# Patient Record
Sex: Female | Born: 1935 | Race: White | Hispanic: No | Smoking: Former smoker
Health system: Southern US, Community
[De-identification: ages and names within clinical notes are randomized; demographics above are authoritative.]

## PROBLEM LIST (undated history)

## (undated) DIAGNOSIS — M810 Age-related osteoporosis without current pathological fracture: Secondary | ICD-10-CM

## (undated) DIAGNOSIS — I10 Essential (primary) hypertension: Secondary | ICD-10-CM

## (undated) DIAGNOSIS — I2510 Atherosclerotic heart disease of native coronary artery without angina pectoris: Secondary | ICD-10-CM

## (undated) DIAGNOSIS — I219 Acute myocardial infarction, unspecified: Secondary | ICD-10-CM

## (undated) HISTORY — PX: ABDOMINAL HYSTERECTOMY: SHX81

## (undated) HISTORY — DX: Acute myocardial infarction, unspecified: I21.9

## (undated) HISTORY — DX: Age-related osteoporosis without current pathological fracture: M81.0

---
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
## 1996-09-04 HISTORY — PX: CATARACT EXTRACTION: SUR2

## 2001-02-27 ENCOUNTER — Ambulatory Visit (HOSPITAL_COMMUNITY): Admission: RE | Admit: 2001-02-27 | Discharge: 2001-02-27 | Payer: Self-pay | Admitting: Family Medicine

## 2001-02-27 ENCOUNTER — Encounter: Payer: Self-pay | Admitting: Family Medicine

## 2002-03-05 ENCOUNTER — Encounter: Payer: Self-pay | Admitting: Family Medicine

## 2002-03-05 ENCOUNTER — Ambulatory Visit (HOSPITAL_COMMUNITY): Admission: RE | Admit: 2002-03-05 | Discharge: 2002-03-05 | Payer: Self-pay | Admitting: Family Medicine

## 2002-03-31 ENCOUNTER — Ambulatory Visit (HOSPITAL_COMMUNITY): Admission: RE | Admit: 2002-03-31 | Discharge: 2002-03-31 | Payer: Self-pay | Admitting: Internal Medicine

## 2003-03-26 ENCOUNTER — Ambulatory Visit (HOSPITAL_COMMUNITY): Admission: RE | Admit: 2003-03-26 | Discharge: 2003-03-26 | Payer: Self-pay | Admitting: Family Medicine

## 2003-03-26 ENCOUNTER — Encounter: Payer: Self-pay | Admitting: Family Medicine

## 2004-04-13 ENCOUNTER — Ambulatory Visit (HOSPITAL_COMMUNITY): Admission: RE | Admit: 2004-04-13 | Discharge: 2004-04-13 | Payer: Self-pay | Admitting: Family Medicine

## 2005-05-03 ENCOUNTER — Ambulatory Visit (HOSPITAL_COMMUNITY): Admission: RE | Admit: 2005-05-03 | Discharge: 2005-05-03 | Payer: Self-pay | Admitting: Family Medicine

## 2006-06-04 ENCOUNTER — Ambulatory Visit (HOSPITAL_COMMUNITY): Admission: RE | Admit: 2006-06-04 | Discharge: 2006-06-04 | Payer: Self-pay | Admitting: Family Medicine

## 2006-06-20 ENCOUNTER — Ambulatory Visit (HOSPITAL_COMMUNITY): Admission: RE | Admit: 2006-06-20 | Discharge: 2006-06-20 | Payer: Self-pay | Admitting: Family Medicine

## 2007-03-05 HISTORY — PX: COLONOSCOPY: SHX174

## 2007-04-01 ENCOUNTER — Encounter: Payer: Self-pay | Admitting: Internal Medicine

## 2007-04-01 ENCOUNTER — Ambulatory Visit: Payer: Self-pay | Admitting: Internal Medicine

## 2007-04-01 ENCOUNTER — Ambulatory Visit (HOSPITAL_COMMUNITY): Admission: RE | Admit: 2007-04-01 | Discharge: 2007-04-01 | Payer: Self-pay | Admitting: Internal Medicine

## 2007-06-01 ENCOUNTER — Emergency Department (HOSPITAL_COMMUNITY): Admission: EM | Admit: 2007-06-01 | Discharge: 2007-06-02 | Payer: Self-pay | Admitting: Emergency Medicine

## 2007-06-05 DIAGNOSIS — I219 Acute myocardial infarction, unspecified: Secondary | ICD-10-CM

## 2007-06-05 HISTORY — DX: Acute myocardial infarction, unspecified: I21.9

## 2007-07-19 ENCOUNTER — Ambulatory Visit (HOSPITAL_COMMUNITY): Admission: RE | Admit: 2007-07-19 | Discharge: 2007-07-19 | Payer: Self-pay | Admitting: Family Medicine

## 2008-07-21 ENCOUNTER — Ambulatory Visit (HOSPITAL_COMMUNITY): Admission: RE | Admit: 2008-07-21 | Discharge: 2008-07-21 | Payer: Self-pay | Admitting: Family Medicine

## 2009-11-22 ENCOUNTER — Ambulatory Visit (HOSPITAL_COMMUNITY): Admission: RE | Admit: 2009-11-22 | Discharge: 2009-11-22 | Payer: Self-pay | Admitting: Family Medicine

## 2011-01-17 NOTE — Op Note (Signed)
Julia Werner, Julia Werner              ACCOUNT NO.:  [id number]   MEDICAL RECORD NO.:  [id number]          PATIENT TYPE:  AMB   LOCATION:  DAY                           FACILITY:  [facility]   PHYSICIAN:  R. Roetta Sessions, M.D. DATE OF BIRTH:  [birth year]   DATE OF PROCEDURE:  04/01/2007  DATE OF DISCHARGE:                               OPERATIVE REPORT   PROCEDURE:  Colonoscopy with biopsies.   INDICATIONS FOR PROCEDURE:  An 77 year old Caucasian female who comes  for high-risk screening colonoscopy.  She has a positive family history  of colon cancer in her sister.  She last had a colonoscopy in 2003 which  revealed left-sided diverticula and the hyperplastic polyp.  She has no  lower GI tract symptoms currently.  Colonoscopy is now being done as a  high-risk screening maneuver.  This approach has been discussed with the  patient at length.  Potential risks, benefits and alternatives have been  reviewed, questions answered.  She is agreeable.  Please see  documentation on the medical record.   PROCEDURE NOTE:  O2 saturation, blood pressure, pulse and respirations  were monitored throughout the entire procedure.   CONSCIOUS SEDATION:  Versed 3 mg IV and Demerol 75 mg IV in divided  doses.   INSTRUMENT:  Pentax video chip system.   FINDINGS:  Digital rectal exam revealed a pea-size abnormality just  inside the anal verge.   ENDOSCOPIC FINDINGS:  The prep was good.   Rectum:  Rectal mucosa including retroflexed view of the anal verge  demonstrated prominent internal hemorrhoids and papilla and an area of  verrucous appearing mucosa extending just 1-2 cm into the distal rectal  mucosa, no obvious neoplasm seen.  Please see photos retroflexed.  I  elected to biopsy the leading edge of this verrucous appearing mucosa.  There was also diminutive polyp in the proximal rectum which was cold  biopsy/removed.  Remainder of the rectal mucosa appeared normal.   Colon:  Colonic mucosa was  surveyed from the rectosigmoid junction  through the left transverse, right colon to the area of the appendiceal  orifice, ileocecal valve and cecum.  These structures were well seen and  photographed for the record.  From this level, the scope was slowly  withdrawn.  All previously mentioned mucosal surfaces were again seen.  The patient had pan colonic diverticula and there was a diminutive polyp  noted in the mid sigmoid which was cold biopsy/removed.  Remainder of  colonic mucosa appeared normal.  The patient tolerated the procedure  well as reactive to endoscopy.   IMPRESSION:  1. Prominent internal hemorrhoids, anal papilla and verrucous      appearing mucosa distal rectum, uncertain significance, biopsied.  2. Proximal diminutive rectal polyp cold biopsied.  The remainder of      the rectal mucosa appeared normal.  3. Left-sided diverticula, diminutive sigmoid polyp cold      biopsies/removed.  Remainder of colonic mucosa appeared normal.   RECOMMENDATIONS:  1. Diverticulosis literature provided to [name].  2. Follow-up on path.  3. Further recommendations to follow.  Jonathon Bellows, M.D.  Electronically Signed     RMR/MEDQ  D:  04/01/2007  T:  04/01/2007  Job:  [id number]   cc:   Lorin Picket A. Gerda Diss, MD  Fax: [phone]

## 2011-01-20 NOTE — Op Note (Signed)
[name], Julia Werner                        ACCOUNT NO.:  [id number]   MEDICAL RECORD NO.:  [id number]                   PATIENT TYPE:  AMB   LOCATION:  DAY                                  FACILITY:  [facility]   PHYSICIAN:  Gerrit Friends. Rourk, M.D.               DATE OF BIRTH:  Aug 12, 1921   DATE OF PROCEDURE:  03/31/2002  DATE OF DISCHARGE:  03/31/2002                                  PROCEDURE NOTE   PROCEDURE:  Colonoscopy and biopsy.   INDICATIONS FOR PROCEDURE:  The patient is a 77 year old lady with no bowel  symptoms.  She underwent a screening sigmoidoscopy back in 2000 and no  significant abnormalities were found.  Unfortunately since that time, her  sister was diagnosed with colorectal cancer.  She now is under a high risk  screening colonoscopy.  The approach of the colonoscopy has been discussed  with the patient at the bedside and the potential risks, benefits, and  alternatives have been reviewed. questions were answered and she is  agreeable.  Please see my handwritten H&P for more information She is low  risk for conscious sedation with Versed and Demerol.   PROCEDURE NOTE:  O2 saturations, blood pressure, pulse, and respirations  were monitored throughout the entire procedure.   CONSCIOUS SEDATION:  Versed 2 mg IV, Demerol 25 mg IV in divided doses.   INSTRUMENT USED:  Olympus video chip colonoscope.   FINDINGS:  Digital rectal examination revealed no abnormalities.   ENDOSCOPIC FINDINGS:  Prep was good.   RECTUM:  Examination of rectal mucosa clean.  Retroflexed view in the anal  verge revealed only a single anal papilla and internal hemorrhoids, and a 5  mm polyp at 10 cm.   COLON:  Colonic mucosa was surveyed from the rectosigmoid junction through  the left, transverse, right colon to the area of the appendiceal orifice,  ileocecal valve and cecum.  These structures were well-seen and photographed  for the record.  The patient had numerous left-sided  diverticulum and the  colonic mucosa to the cecum appeared normal from the level of the cecum and  ileocecal valve.  The scope was slowly and cautiously withdrawn.  All  previous mentioned mucosal surfaces were again seen and no other  abnormalities were observed.  A small polyp in the rectum was cold-  biopsied/removed.  The patient tolerated the procedure well and was  reactivated.   ENDOSCOPY IMPRESSION:  1. Anal papilla and internal hemorrhoids, diminutive rectal polyp cold-     biopsied/removed.  Random rectum mucosa appeared  normal.  2. Left-sided diverticulum and colonic mucosa appeared normal.   RECOMMENDATIONS:  1. Diverticulosis literature was provided to [name].  2.     Daily Metamucil or Citrucel fiber supplement.  3. Follow up on pathology.  4. Further recommendations to follow.  Gerrit Friends. Rourk, M.D.    RMR/MEDQ  D:  03/31/2002  T:  04/03/2002  Job:  [id number]   cc:   Lorin Picket A. Gerda Diss, M.D.

## 2011-06-15 LAB — POCT CARDIAC MARKERS
CKMB, poc: 2.9
Myoglobin, poc: 183
Myoglobin, poc: 72.6
Myoglobin, poc: 97.4
Troponin i, poc: <0.05

## 2011-06-15 LAB — CBC
HCT: 43.1
RBC: 4.88
WBC: 8.5

## 2011-06-15 LAB — CK TOTAL AND CKMB (NOT AT ARMC)
Relative Index: INVALID
Total CK: 84

## 2011-06-15 LAB — BASIC METABOLIC PANEL
CO2: 28
Calcium: 10.6 — ABNORMAL HIGH
Chloride: 103
Creatinine, Ser: 0.9
GFR calc non Af Amer: 60 — ABNORMAL LOW

## 2011-06-15 LAB — B-NATRIURETIC PEPTIDE (CONVERTED LAB): Pro B Natriuretic peptide (BNP): <30

## 2011-06-15 LAB — DIFFERENTIAL
Basophils Relative: 0
Neutrophils Relative %: 53

## 2011-11-24 ENCOUNTER — Other Ambulatory Visit: Payer: Self-pay | Admitting: Family Medicine

## 2011-12-20 ENCOUNTER — Ambulatory Visit (HOSPITAL_COMMUNITY)
Admission: RE | Admit: 2011-12-20 | Discharge: 2011-12-20 | Disposition: A | Discharge status: Home / Self Care | Payer: Medicare Other | Source: Ambulatory Visit | Attending: Family Medicine | Admitting: Family Medicine

## 2011-12-20 DIAGNOSIS — M810 Age-related osteoporosis without current pathological fracture: Secondary | ICD-10-CM | POA: Insufficient documentation

## 2011-12-20 DIAGNOSIS — Z780 Asymptomatic menopausal state: Secondary | ICD-10-CM | POA: Insufficient documentation

## 2012-02-06 ENCOUNTER — Emergency Department (HOSPITAL_COMMUNITY): Payer: Medicare Other

## 2012-02-06 ENCOUNTER — Emergency Department (HOSPITAL_COMMUNITY)
Admission: EM | Admit: 2012-02-06 | Discharge: 2012-02-06 | Disposition: A | Discharge status: Home / Self Care | Payer: Medicare Other | Attending: Emergency Medicine | Admitting: Emergency Medicine

## 2012-02-06 ENCOUNTER — Encounter (HOSPITAL_COMMUNITY): Payer: Self-pay | Admitting: *Deleted

## 2012-02-06 DIAGNOSIS — G319 Degenerative disease of nervous system, unspecified: Secondary | ICD-10-CM | POA: Insufficient documentation

## 2012-02-06 DIAGNOSIS — S8000XA Contusion of unspecified knee, initial encounter: Secondary | ICD-10-CM | POA: Insufficient documentation

## 2012-02-06 DIAGNOSIS — I2510 Atherosclerotic heart disease of native coronary artery without angina pectoris: Secondary | ICD-10-CM | POA: Insufficient documentation

## 2012-02-06 DIAGNOSIS — W19XXXA Unspecified fall, initial encounter: Secondary | ICD-10-CM

## 2012-02-06 DIAGNOSIS — I10 Essential (primary) hypertension: Secondary | ICD-10-CM | POA: Insufficient documentation

## 2012-02-06 DIAGNOSIS — W010XXA Fall on same level from slipping, tripping and stumbling without subsequent striking against object, initial encounter: Secondary | ICD-10-CM | POA: Insufficient documentation

## 2012-02-06 HISTORY — DX: Essential (primary) hypertension: I10

## 2012-02-06 HISTORY — DX: Atherosclerotic heart disease of native coronary artery without angina pectoris: I25.10

## 2012-02-06 NOTE — ED Provider Notes (Signed)
History  This chart was scribed for Glynn Octave, MD by Bennett Scrape. This patient was seen in room APA01/APA01 and the patient's care was started at 3:02PM.  CSN: [id number]  Arrival date & time 02/06/12  1452   First MD Initiated Contact with Patient 02/06/12 1502      Chief Complaint  Patient presents with  . Fall    The history is provided by the patient. No language interpreter was used.    Julia Werner is a 78 y.o. female who presents to the Emergency Department complaining of a slip and fall at the [facility] approximately 30 minutes PTA. She states that she was walking down the hall when she slipped in a puddle of water landing on her right knee. She c/o mild right knee pain currently. She denies any modifying factors. She has not taken any OTC medications PTA to improve her pain. She denies having problems with the right knee prior to the fall. She denies LOC and head trauma. She denies dizziness or syncope as the cause of the fall. She denies HA, nausea and right hip pain as associated symptoms. Pt has a h/o CAD and HTN. She states that she takes baby ASA and plavix. She is a former smoker but denies alcohol use.  Dr. Lilyan Punt is PCP.   Past Medical History  Diagnosis Date  . Coronary artery disease   . Hypertension     Past Surgical History  Procedure Date  . Abdominal hysterectomy     History reviewed. No pertinent family history.  History  Substance Use Topics  . Smoking status: Former Games developer  . Smokeless tobacco: Not on file  . Alcohol Use: No    Review of Systems  A complete 10 system review of systems was obtained and all systems are negative except as noted in the HPI and PMH.   Allergies  Review of patient's allergies indicates no known allergies.  Home Medications  No current outpatient prescriptions on file.  Triage Vitals: BP 154/89  Pulse 70  Temp(Src) 98.1 F (36.7 C) (Oral)  Resp 17  Ht 5\' 4"  (1.626 m)  Wt 110 lb (49.896  kg)  BMI 18.88 kg/m2  SpO2 98%  Physical Exam  Nursing note and vitals reviewed. Constitutional: She is oriented to person, place, and time. She appears well-developed and well-nourished. No distress.  HENT:  Head: Normocephalic and atraumatic.  Eyes: Conjunctivae and EOM are normal.  Neck: Neck supple. No tracheal deviation present.  Cardiovascular: Normal rate and regular rhythm.   Pulmonary/Chest: Effort normal and breath sounds normal. No respiratory distress.  Abdominal: Soft. She exhibits no distension.  Musculoskeletal: Normal range of motion.       Small abrasion and ecchymosis to the right patella, no effusion, full ROM, no ligament laxation, flexion and extension intact, +2 DP and PT pulses  Neurological: She is alert and oriented to person, place, and time.       No sensory deficits  Skin: Skin is warm and dry.  Psychiatric: She has a normal mood and affect. Her behavior is normal.    ED Course  Procedures (including critical care time)  DIAGNOSTIC STUDIES: Oxygen Saturation is 98% on room air, normal by my interpretation.    COORDINATION OF CARE: 3:12PM-Discussed treatment plan which includes x-ray of the right knee with pt and pt agreed to plan. Pt turned down pain medications. 3:45PM-Informed radiology reports with pt. Discussed treatment plan with pt and pt agreed to plan.  Labs  Reviewed - No data to display Ct Head Wo Contrast  02/06/2012  *RADIOLOGY REPORT*  Clinical Data: Fall  CT HEAD WITHOUT CONTRAST  Technique:  Contiguous axial images were obtained from the base of the skull through the vertex without contrast.  Comparison: None.  Findings: No skull fracture is noted.  Paranasal sinuses and mastoid air cells are unremarkable.  No intracranial hemorrhage, mass effect or midline shift.  No acute infarction.  No mass lesion is noted on this unenhanced scan.  Mild cerebral atrophy.  Mild atherosclerotic calcifications of carotid siphon are noted.  IMPRESSION: No  acute intracranial abnormality.  Mild cerebral atrophy.  Mild atherosclerotic calcifications of carotid siphon.  Original Report Authenticated By: Natasha Mead, M.D.   Dg Knee Complete 4 Views Right  02/06/2012  *RADIOLOGY REPORT*  Clinical Data: Right knee pain and bruising post fall  RIGHT KNEE - COMPLETE 4+ VIEW  Comparison: None  Findings: Bones appear diffusely demineralized. Joint spaces fairly well preserved for age. No definite fracture, dislocation or bone destruction. No knee joint effusion.  IMPRESSION: No acute osseous abnormalities. Osseous demineralization.  Original Report Authenticated By: Lollie Marrow, M.D.     No diagnosis found.    MDM  Slip and mechanical fall on some water while visiting [facility]. Did not hit head or lose consciousness. Only complaint is right knee pain. No weakness, numbness, tingling. On aspirin and Plavix.  Imaging negative for acute injury.  Patient declines analgesia.  Stable for PCP followup.  I personally performed the services described in this documentation, which was scribed in my presence.  The recorded information has been reviewed and considered.       Glynn Octave, MD 02/06/12 [phone]

## 2012-02-06 NOTE — ED Notes (Signed)
Pt states that she slipped and fell on some water in the floor at the [facility]. C/o pain in her right knee.

## 2012-02-06 NOTE — Discharge Instructions (Signed)
Contusion There is no evidence of serious injury. Use Motrin or Tylenol as needed for pain. Followup with her Dr. This week. Return to the ED if you develop new or worsening symptoms. A contusion is a deep bruise. Contusions are the result of an injury that caused bleeding under the skin. The contusion may turn blue, purple, or yellow. Minor injuries will give you a painless contusion, but more severe contusions may stay painful and swollen for a few weeks.  CAUSES  A contusion is usually caused by a blow, trauma, or direct force to an area of the body. SYMPTOMS   Swelling and redness of the injured area.   Bruising of the injured area.   Tenderness and soreness of the injured area.   Pain.  DIAGNOSIS  The diagnosis can be made by taking a history and physical exam. An X-ray, CT scan, or MRI may be needed to determine if there were any associated injuries, such as fractures. TREATMENT  Specific treatment will depend on what area of the body was injured. In general, the best treatment for a contusion is resting, icing, elevating, and applying cold compresses to the injured area. Over-the-counter medicines may also be recommended for pain control. Ask your caregiver what the best treatment is for your contusion. HOME CARE INSTRUCTIONS   Put ice on the injured area.   Put ice in a plastic bag.   Place a towel between your skin and the bag.   Leave the ice on for 15 to 20 minutes, 3 to 4 times a day.   Only take over-the-counter or prescription medicines for pain, discomfort, or fever as directed by your caregiver. Your caregiver may recommend avoiding anti-inflammatory medicines (aspirin, ibuprofen, and naproxen) for 48 hours because these medicines may increase bruising.   Rest the injured area.   If possible, elevate the injured area to reduce swelling.  SEEK IMMEDIATE MEDICAL CARE IF:   You have increased bruising or swelling.   You have pain that is getting worse.   Your  swelling or pain is not relieved with medicines.  MAKE SURE YOU:   Understand these instructions.   Will watch your condition.   Will get help right away if you are not doing well or get worse.  Document Released: 05/31/2005 Document Revised: 08/10/2011 Document Reviewed: 06/26/2011 [facility] Patient Information 2012 [place], [place].

## 2012-02-12 ENCOUNTER — Encounter: Payer: Self-pay | Admitting: Internal Medicine

## 2012-08-05 ENCOUNTER — Other Ambulatory Visit (HOSPITAL_COMMUNITY): Payer: Self-pay | Admitting: Cardiovascular Disease

## 2012-08-05 DIAGNOSIS — R079 Chest pain, unspecified: Secondary | ICD-10-CM

## 2012-08-05 DIAGNOSIS — I350 Nonrheumatic aortic (valve) stenosis: Secondary | ICD-10-CM

## 2012-08-05 DIAGNOSIS — I2510 Atherosclerotic heart disease of native coronary artery without angina pectoris: Secondary | ICD-10-CM

## 2012-08-16 ENCOUNTER — Inpatient Hospital Stay (HOSPITAL_COMMUNITY): Admission: RE | Admit: 2012-08-16 | Payer: Medicare Other | Source: Ambulatory Visit

## 2012-08-16 ENCOUNTER — Ambulatory Visit (HOSPITAL_COMMUNITY): Payer: Medicare Other

## 2012-09-24 ENCOUNTER — Ambulatory Visit (HOSPITAL_COMMUNITY)
Admission: RE | Admit: 2012-09-24 | Discharge: 2012-09-24 | Disposition: A | Discharge status: Home / Self Care | Payer: Medicare Other | Source: Ambulatory Visit | Attending: Cardiovascular Disease | Admitting: Cardiovascular Disease

## 2012-09-24 DIAGNOSIS — R42 Dizziness and giddiness: Secondary | ICD-10-CM | POA: Insufficient documentation

## 2012-09-24 DIAGNOSIS — I350 Nonrheumatic aortic (valve) stenosis: Secondary | ICD-10-CM

## 2012-09-24 DIAGNOSIS — R079 Chest pain, unspecified: Secondary | ICD-10-CM

## 2012-09-24 DIAGNOSIS — I2510 Atherosclerotic heart disease of native coronary artery without angina pectoris: Secondary | ICD-10-CM | POA: Insufficient documentation

## 2012-09-24 DIAGNOSIS — I10 Essential (primary) hypertension: Secondary | ICD-10-CM | POA: Insufficient documentation

## 2012-09-24 MED ORDER — TECHNETIUM TC 99M SESTAMIBI GENERIC - CARDIOLITE
30.5000 | Freq: Once | INTRAVENOUS | Status: AC | PRN
  Administered 2012-09-24: 31 via INTRAVENOUS

## 2012-09-24 MED ORDER — REGADENOSON 0.4 MG/5ML IV SOLN
0.4000 mg | Freq: Once | INTRAVENOUS | Status: AC
  Administered 2012-09-24: 0.4 mg via INTRAVENOUS

## 2012-09-24 MED ORDER — AMINOPHYLLINE 25 MG/ML IV SOLN
75.0000 mg | Freq: Once | INTRAVENOUS | Status: AC
  Administered 2012-09-24: 75 mg via INTRAVENOUS

## 2012-09-24 MED ORDER — TECHNETIUM TC 99M SESTAMIBI GENERIC - CARDIOLITE
10.2000 | Freq: Once | INTRAVENOUS | Status: AC | PRN
  Administered 2012-09-24: 10 via INTRAVENOUS

## 2012-09-24 NOTE — Progress Notes (Signed)
[facility]   2D echo completed 09/24/2012.   Cindy Moe Graca, RDCS   

## 2012-09-24 NOTE — Procedures (Addendum)
[facility] [address] [place] [address] [phone]  Cardiology Nuclear Med Study  Julia Werner is a 79 y.o. female     MRN : [id number]     DOB: February 28, 1921  Procedure Date: 09/24/2012  Nuclear Med Background Indication for Stress Test:  Evaluation for Ischemia History:  CAD Cardiac Risk Factors: Family History - CAD, History of Smoking, Hypertension and Lipids  Symptoms:  Dizziness   Nuclear Pre-Procedure Caffeine/Decaff Intake:  10:00pm NPO After: 8:00am   IV Site: R Antecubital  IV 0.9% NS with Angio Cath:  22g  Chest Size (in):  n/a IV Started by: Koren Shiver, CNMT  Height: 5\' 3"  (1.6 m)  Cup Size: A  BMI:  Body mass index is 18.42 kg/(m^2). Weight:  104 lb (47.174 kg)   Tech Comments:  n/a    Nuclear Med Study 1 or 2 day study: 1 day  Stress Test Type:  Lexiscan  Order Authorizing Provider:  Susa Griffins, MD   Resting Radionuclide: Technetium 66m Sestamibi  Resting Radionuclide Dose: 10.2 mCi   Stress Radionuclide:  Technetium 35m Sestamibi  Stress Radionuclide Dose: 30.5 mCi           Stress Protocol Rest HR: 59 Stress HR: 80  Rest BP: 143/98 Stress BP: 155/96  Exercise Time (min): n/a METS: n/a          Dose of Adenosine (mg):  n/a Dose of Lexiscan: 0.4 mg  Dose of Atropine (mg): n/a Dose of Dobutamine: n/a mcg/kg/min (at max HR)  Stress Test Technologist: Ernestene Mention, CCT Nuclear Technologist: Gonzella Lex, CNMT   Rest Procedure:  Myocardial perfusion imaging was performed at rest 45 minutes following the intravenous administration of Technetium 70m Sestamibi. Stress Procedure:  The patient received IV Lexiscan 0.4 mg over 15-seconds.  Technetium 38m Sestamibi injected at 30-seconds.  Patient was given IV Aminophylline 75 mg due to shortness of breath, stomach pains and nausea. Symptoms were resolved. There were no significant changes with Lexiscan.  Quantitative spect images were  obtained after a 45 minute delay.  Transient Ischemic Dilatation (Normal <1.22):  1.0 Lung/Heart Ratio (Normal <0.45):  0.29 QGS EDV:  68 ml QGS ESV:  22 ml LV Ejection Fraction: 67%  Signed by      Rest ECG: NSR - Normal EKG  Stress ECG: No significant change from baseline ECG; rare PAC  QPS Raw Data Images:  Normal; no motion artifact; normal heart/lung ratio. Stress Images:  Normal homogeneous uptake in all areas of the myocardium. Rest Images:  Normal homogeneous uptake in all areas of the myocardium. Subtraction (SDS):  Normal  Impression Exercise Capacity:  Lexiscan with no exercise. BP Response:  Normal blood pressure response. Clinical Symptoms:  No significant symptoms noted. ECG Impression:  No significant ST segment change suggestive of ischemia. Comparison with Prior Nuclear Study: No significant change from previous study  Overall Impression:  Normal stress nuclear study. and Low risk stress nuclear study.  LV Wall Motion:  NL LV Function, EF 67%; NL Wall Motion   Lennette Bihari, MD  09/24/2012 6:32 PM

## 2012-11-22 ENCOUNTER — Encounter: Payer: Self-pay | Admitting: *Deleted

## 2012-11-22 DIAGNOSIS — E785 Hyperlipidemia, unspecified: Secondary | ICD-10-CM | POA: Insufficient documentation

## 2012-11-22 DIAGNOSIS — I10 Essential (primary) hypertension: Secondary | ICD-10-CM

## 2012-11-22 DIAGNOSIS — M810 Age-related osteoporosis without current pathological fracture: Secondary | ICD-10-CM | POA: Insufficient documentation

## 2012-11-26 ENCOUNTER — Ambulatory Visit (INDEPENDENT_AMBULATORY_CARE_PROVIDER_SITE_OTHER): Payer: Medicare Other | Admitting: Family Medicine

## 2012-11-26 ENCOUNTER — Encounter: Payer: Self-pay | Admitting: Family Medicine

## 2012-11-26 VITALS — BP 128/78 | HR 90 | Ht 63.0 in | Wt 104.8 lb

## 2012-11-26 DIAGNOSIS — I10 Essential (primary) hypertension: Secondary | ICD-10-CM

## 2012-11-26 DIAGNOSIS — Z79899 Other long term (current) drug therapy: Secondary | ICD-10-CM

## 2012-11-26 DIAGNOSIS — E785 Hyperlipidemia, unspecified: Secondary | ICD-10-CM

## 2012-11-26 MED ORDER — CLOPIDOGREL BISULFATE 75 MG PO TABS: 75.0000 mg | ORAL_TABLET | Freq: Every day | ORAL | Status: DC

## 2012-11-26 MED ORDER — LISINOPRIL 10 MG PO TABS: 10.0000 mg | ORAL_TABLET | Freq: Every day | ORAL | Status: DC

## 2012-11-26 MED ORDER — SIMVASTATIN 40 MG PO TABS: 40.0000 mg | ORAL_TABLET | Freq: Every evening | ORAL | Status: DC

## 2012-11-26 NOTE — Progress Notes (Signed)
  Subjective:    Patient ID: Julia Werner, female    DOB: November 05, 1920, 79 y.o.   MRN: [id number]  Hypertension This is a chronic problem. The current episode started more than 1 year ago. The problem has been gradually improving since onset. The problem is controlled. Pertinent negatives include no anxiety, blurred vision, chest pain, headaches, neck pain, orthopnea, PND or shortness of breath. There are no associated agents to hypertension. There are no known risk factors for coronary artery disease. Past treatments include ACE inhibitors and beta blockers. The current treatment provides significant improvement. There are no compliance problems.  There is no history of angina, kidney disease or CAD/MI.      Review of Systems  Constitutional: Negative.   HENT: Negative for neck pain.   Eyes: Negative for blurred vision.  Respiratory: Negative for apnea and shortness of breath.   Cardiovascular: Negative.  Negative for chest pain, orthopnea and PND.  Gastrointestinal: Negative.   Endocrine: Negative.   Neurological: Negative for headaches.       Objective:   Physical Exam  Constitutional: She appears well-developed and well-nourished.  HENT:  Head: Normocephalic.  Neck: Normal range of motion. No JVD present. No thyromegaly present.  Cardiovascular: Normal rate, regular rhythm and normal heart sounds.   Pulmonary/Chest: Effort normal and breath sounds normal. No respiratory distress.  Musculoskeletal: She exhibits no edema.  Lymphadenopathy:    She has no cervical adenopathy.          Assessment & Plan:  Hypertension  Other and unspecified hyperlipidemia - Plan: Lipid panel  Encounter for long-term (current) use of other medications - Plan: Hepatic function panel, Basic metabolic panel  Patient overall well controlled she needs followup in approximately 6 months sooner if any problems.

## 2012-11-28 LAB — HEPATIC FUNCTION PANEL
Albumin: 4.5 g/dL (ref 3.5–5.2)
Alkaline Phosphatase: 75 U/L (ref 39–117)
Total Protein: 7.1 g/dL (ref 6.0–8.3)

## 2012-11-28 LAB — LIPID PANEL
HDL: 70 mg/dL (ref >39)
LDL Cholesterol: 56 mg/dL (ref 0–99)
Triglycerides: 73 mg/dL (ref <150)

## 2012-11-28 LAB — BASIC METABOLIC PANEL
Calcium: 10.3 mg/dL (ref 8.4–10.5)
Chloride: 106 mEq/L (ref 96–112)
Creat: 1.07 mg/dL (ref 0.50–1.10)

## 2012-11-29 ENCOUNTER — Encounter: Payer: Self-pay | Admitting: Family Medicine

## 2012-12-31 ENCOUNTER — Other Ambulatory Visit: Payer: Self-pay | Admitting: Family Medicine

## 2013-01-22 ENCOUNTER — Other Ambulatory Visit: Payer: Self-pay | Admitting: Family Medicine

## 2013-02-04 ENCOUNTER — Ambulatory Visit (HOSPITAL_COMMUNITY)
Admission: RE | Admit: 2013-02-04 | Discharge: 2013-02-04 | Disposition: A | Discharge status: Home / Self Care | Payer: Medicare Other | Source: Ambulatory Visit | Attending: Cardiovascular Disease | Admitting: Cardiovascular Disease

## 2013-02-04 DIAGNOSIS — E785 Hyperlipidemia, unspecified: Secondary | ICD-10-CM | POA: Insufficient documentation

## 2013-02-04 DIAGNOSIS — I10 Essential (primary) hypertension: Secondary | ICD-10-CM | POA: Insufficient documentation

## 2013-02-04 DIAGNOSIS — I359 Nonrheumatic aortic valve disorder, unspecified: Secondary | ICD-10-CM | POA: Insufficient documentation

## 2013-02-04 NOTE — Progress Notes (Signed)
*  PRELIMINARY RESULTS* Echocardiogram 2D Echocardiogram has been performed.  Conrad Eureka 02/04/2013, 2:07 PM

## 2013-02-11 ENCOUNTER — Other Ambulatory Visit: Payer: Self-pay | Admitting: *Deleted

## 2013-02-11 MED ORDER — CLOPIDOGREL BISULFATE 75 MG PO TABS: 75.0000 mg | ORAL_TABLET | Freq: Every day | ORAL | Status: DC

## 2013-04-28 ENCOUNTER — Other Ambulatory Visit: Payer: Self-pay | Admitting: Cardiovascular Disease

## 2013-04-28 LAB — LIPID PANEL
Total CHOL/HDL Ratio: 1.9 Ratio
VLDL: 19 mg/dL (ref 0–40)

## 2013-04-28 LAB — COMPREHENSIVE METABOLIC PANEL
ALT: 22 U/L (ref 0–35)
AST: 26 U/L (ref 0–37)
Calcium: 10.4 mg/dL (ref 8.4–10.5)
Chloride: 105 mEq/L (ref 96–112)
Creat: 1.07 mg/dL (ref 0.50–1.10)
Sodium: 143 mEq/L (ref 135–145)
Total Bilirubin: 0.9 mg/dL (ref 0.3–1.2)
Total Protein: 7.1 g/dL (ref 6.0–8.3)

## 2013-04-28 LAB — CBC WITH DIFFERENTIAL/PLATELET
Basophils Absolute: 0 10*3/uL (ref 0.0–0.1)
Eosinophils Relative: 1 % (ref 0–5)
Lymphocytes Relative: 24 % (ref 12–46)
MCV: 88 fL (ref 78.0–100.0)
Neutrophils Relative %: 64 % (ref 43–77)
Platelets: 209 10*3/uL (ref 150–400)
RBC: 4.98 MIL/uL (ref 3.87–5.11)
RDW: 13.9 % (ref 11.5–15.5)
WBC: 5.4 10*3/uL (ref 4.0–10.5)

## 2013-04-28 LAB — TSH: TSH: 4.883 u[IU]/mL — ABNORMAL HIGH (ref 0.350–4.500)

## 2013-05-06 ENCOUNTER — Other Ambulatory Visit: Payer: Self-pay | Admitting: Cardiovascular Disease

## 2013-05-06 LAB — BASIC METABOLIC PANEL
Calcium: 10.5 mg/dL (ref 8.4–10.5)
Potassium: 4.6 mEq/L (ref 3.5–5.3)
Sodium: 141 mEq/L (ref 135–145)

## 2013-05-13 ENCOUNTER — Encounter: Payer: Self-pay | Admitting: Cardiovascular Disease

## 2013-06-16 ENCOUNTER — Ambulatory Visit (INDEPENDENT_AMBULATORY_CARE_PROVIDER_SITE_OTHER): Payer: Medicare Other | Admitting: Family Medicine

## 2013-06-16 ENCOUNTER — Encounter: Payer: Self-pay | Admitting: Family Medicine

## 2013-06-16 VITALS — BP 118/76 | Ht 61.0 in | Wt 105.0 lb

## 2013-06-16 DIAGNOSIS — I951 Orthostatic hypotension: Secondary | ICD-10-CM

## 2013-06-16 DIAGNOSIS — E785 Hyperlipidemia, unspecified: Secondary | ICD-10-CM

## 2013-06-16 DIAGNOSIS — R5381 Other malaise: Secondary | ICD-10-CM

## 2013-06-16 DIAGNOSIS — Z23 Encounter for immunization: Secondary | ICD-10-CM

## 2013-06-16 DIAGNOSIS — G609 Hereditary and idiopathic neuropathy, unspecified: Secondary | ICD-10-CM

## 2013-06-16 MED ORDER — LISINOPRIL 5 MG PO TABS: 5.0000 mg | ORAL_TABLET | Freq: Every day | ORAL | Status: DC

## 2013-06-16 NOTE — Progress Notes (Signed)
  Subjective:    Patient ID: Julia Werner, female    DOB: Jan 16, 1921, 79 y.o.   MRN: [id number]  HPIhere for a check up.   Having bilateral leg and foot numbness for a couple of weeks. She describes this as feeling more of like there is cramps in her feet along with feeling fatigued and tired in addition to this she describes numbness in her feet  Dizzy off and on for a couple of weeks. She notices this mostly when she stands up. Her last a few minutes then it seems to gradually get better she denies any passing out spells.  She has a history of heart disease osteoporosis she is somewhat saddened by the loss of her husband who died in 01-02-2023. Review of Systems See above. Denies chest tightness shortness of breath nausea vomiting denies headaches fevers chills    Objective:   Physical Exam Lungs are clear hearts regular pulse normal blood pressure 118/68 slight drop when she stands extremities no edema pulses in the feet are good has diminished monofilament testing on the right side moderate on the left side       Assessment & Plan:  #1 peripheral neuropathy-more pronounced in the right foot than the left foot pulses are overall good, will do some lab work. If this does not define what is going on then the next step will be to set up for nerve conduction studies #2 orthostatic hypotension-reduce lisinopril from 10 mg 5 mg followup patient approximately 3 months he off things are going. Patient is to let us know if this doesn't help #3 cardiovascular disease stable  #4 flu shot today

## 2013-06-17 LAB — BASIC METABOLIC PANEL
BUN: 21 mg/dL (ref 6–23)
Calcium: 10.1 mg/dL (ref 8.4–10.5)
Creat: 1.11 mg/dL — ABNORMAL HIGH (ref 0.50–1.10)
Glucose, Bld: 128 mg/dL — ABNORMAL HIGH (ref 70–99)

## 2013-06-17 LAB — LIPID PANEL
Cholesterol: 139 mg/dL (ref 0–200)
HDL: 78 mg/dL (ref >39)
Total CHOL/HDL Ratio: 1.8 Ratio

## 2013-06-18 LAB — VITAMIN B12: Vitamin B-12: 689 pg/mL (ref 211–911)

## 2013-06-18 LAB — TSH: TSH: 5.111 u[IU]/mL — ABNORMAL HIGH (ref 0.350–4.500)

## 2013-09-18 ENCOUNTER — Encounter: Payer: Self-pay | Admitting: Family Medicine

## 2013-09-18 ENCOUNTER — Ambulatory Visit (INDEPENDENT_AMBULATORY_CARE_PROVIDER_SITE_OTHER): Payer: Medicare Other | Admitting: Family Medicine

## 2013-09-18 VITALS — BP 124/80 | Ht 61.0 in | Wt 106.8 lb

## 2013-09-18 DIAGNOSIS — I351 Nonrheumatic aortic (valve) insufficiency: Secondary | ICD-10-CM

## 2013-09-18 DIAGNOSIS — I359 Nonrheumatic aortic valve disorder, unspecified: Secondary | ICD-10-CM

## 2013-09-18 MED ORDER — METOPROLOL SUCCINATE ER 25 MG PO TB24: 12.5000 mg | ORAL_TABLET | Freq: Every day | ORAL | Status: DC | PRN

## 2013-09-18 MED ORDER — SIMVASTATIN 40 MG PO TABS: ORAL_TABLET | ORAL | Status: DC

## 2013-09-18 NOTE — Progress Notes (Signed)
   Subjective:    Patient ID: Julia DawleySallie P Werner, female    DOB: [birth year], 80 y.o.   MRN: [id number]  HPI Patient is here today d/t numbness in er feet. She has been here in the past for the same symptoms. She states the numbness and cramps are getting better. It comes and goes. Patient did have recent testing which overall looked good.  She also c/o fullness in her right ear. She has difficulty hearing in both ears.  Pt would like to be referred to a cardiologist b/c her's is no longer taking any more patients. She has aortic insufficiency denies any significant shortness breath she does not do a lot of physical activity she does relate that she would like to have a local cardiologist. Denies any chest pain or shortness of breath.    Review of Systems Patient denies chest pain shortness breath difficulty swallowing denies abdominal pain. Relates difficulty hearing. Relates numbness in the feet.    Objective:   Physical Exam Bilateral cerumen impaction Neck no masses Lungs no crackles respiratory rate normal Heart regular with slight aortic insufficiency murmur Abdomen soft Extremities no edema blood pressure good       Assessment & Plan:  #1 peripheral neuropathy in the feet will do nerve conduction study I doubt that there is anything we can necessarily do to treat it I would not recommend nortriptyline and gabapentin but may need additional testing  #2 aortic insufficiency stable currently but she needs a local call cardiologist. Her cardiologist retired from patient care  #3 hearing impairment this is due to cerumen impaction she needs a followup for cerumen irrigation both ears

## 2013-09-18 NOTE — Addendum Note (Signed)
Addended by: Lilyan PuntLUKING, SCOTT A on: 09/18/2013 05:38 PM   Modules accepted: Level of Service

## 2013-09-24 ENCOUNTER — Ambulatory Visit (INDEPENDENT_AMBULATORY_CARE_PROVIDER_SITE_OTHER): Payer: Medicare Other | Admitting: *Deleted

## 2013-09-24 DIAGNOSIS — H6120 Impacted cerumen, unspecified ear: Secondary | ICD-10-CM

## 2013-09-24 NOTE — Progress Notes (Signed)
   Subjective:    Patient ID: Julia Werner, female    DOB: May 18, 1921, 80 y.o.   MRN: [id number]  HPI Bilateral ear irrigation without difficulty.   Review of Systems     Objective:   Physical Exam        Assessment & Plan:

## 2013-11-27 ENCOUNTER — Other Ambulatory Visit: Payer: Self-pay | Admitting: *Deleted

## 2013-11-27 MED ORDER — CLOPIDOGREL BISULFATE 75 MG PO TABS: 75.0000 mg | ORAL_TABLET | Freq: Every day | ORAL | Status: AC

## 2013-11-27 MED ORDER — LISINOPRIL 5 MG PO TABS: 5.0000 mg | ORAL_TABLET | Freq: Every day | ORAL | Status: DC

## 2013-11-27 MED ORDER — METOPROLOL SUCCINATE ER 25 MG PO TB24: 12.5000 mg | ORAL_TABLET | Freq: Every day | ORAL | Status: AC | PRN

## 2013-11-27 MED ORDER — SIMVASTATIN 40 MG PO TABS: ORAL_TABLET | ORAL | Status: AC

## 2013-12-16 ENCOUNTER — Encounter: Payer: Self-pay | Admitting: Cardiovascular Disease

## 2013-12-16 ENCOUNTER — Ambulatory Visit (INDEPENDENT_AMBULATORY_CARE_PROVIDER_SITE_OTHER): Payer: Medicare Other | Admitting: Cardiovascular Disease

## 2013-12-16 VITALS — BP 148/69 | HR 67 | Ht 61.0 in | Wt 106.0 lb

## 2013-12-16 DIAGNOSIS — I2510 Atherosclerotic heart disease of native coronary artery without angina pectoris: Secondary | ICD-10-CM

## 2013-12-16 DIAGNOSIS — I10 Essential (primary) hypertension: Secondary | ICD-10-CM

## 2013-12-16 DIAGNOSIS — E785 Hyperlipidemia, unspecified: Secondary | ICD-10-CM

## 2013-12-16 DIAGNOSIS — Z955 Presence of coronary angioplasty implant and graft: Secondary | ICD-10-CM | POA: Insufficient documentation

## 2013-12-16 NOTE — Progress Notes (Signed)
Patient ID: Julia DawleySallie P Hammett, female   DOB: August 22, 1921, 80 y.o.   MRN: [id number]      SUBJECTIVE: The patient is a 80 year old woman who I am evaluating for the first time. She reportedly has a history of coronary artery disease and was treated for a non-STEMI with a drug-eluting stent to the left anterior descending coronary artery in 2008. She reportedly underwent a normal nuclear stress test in January 2014, and she also reportedly underwent an echocardiogram at that time which showed normal left ventricular systolic function. She has had a very difficult year. Her husband of 63 years passed away one year ago. She also lost a sister and brother in the past few months. She has one niece who [place]. She very seldom has chest pain, and this appears to be positional when she lies on her right side. When she lies on her back the pain disappears. She denies shortness of breath. She occasionally has lightheadedness and dizziness, but denies syncope. She also denies leg swelling.    No Known Allergies  Current Outpatient Prescriptions  Medication Sig Dispense Refill  . aspirin EC 81 MG tablet Take 81 mg by mouth every evening.      . cholecalciferol (VITAMIN D) 1000 UNITS tablet Take 1,000 Units by mouth every morning.      . clopidogrel (PLAVIX) 75 MG tablet Take 1 tablet (75 mg total) by mouth daily.  90 tablet  1  . fish oil-omega-3 fatty acids 1000 MG capsule Take 1 g by mouth every morning.      Marland Kitchen. lisinopril (PRINIVIL,ZESTRIL) 5 MG tablet Take 10 mg by mouth daily.      . metoprolol succinate (TOPROL-XL) 25 MG 24 hr tablet Take 0.5 tablets (12.5 mg total) by mouth daily as needed.  45 tablet  1  . nitroGLYCERIN (NITROSTAT) 0.4 MG SL tablet Place 0.4 mg under the tongue every 5 (five) minutes as needed for chest pain.      . simvastatin (ZOCOR) 40 MG tablet TAKE 1 TABLET BY MOUTH EVERY DAY  90 tablet  1   No current facility-administered medications for this visit.    Past  Medical History  Diagnosis Date  . Coronary artery disease   . Hypertension   . Osteoporosis   . Myocardial infarction 06/2007    stent    Past Surgical History  Procedure Laterality Date  . Abdominal hysterectomy    . Cataract extraction Bilateral 1998  . Colonoscopy  03/2007    polyps repeat 5 years  . Dilation and curettage of uterus  1956    History   Social History  . Marital Status: Widowed    Spouse Name: N/A    Number of Children: N/A  . Years of Education: N/A   Occupational History  . Not on file.   Social History Main Topics  . Smoking status: Former Games developermoker  . Smokeless tobacco: Not on file  . Alcohol Use: No  . Drug Use: No  . Sexual Activity: Not on file   Other Topics Concern  . Not on file   Social History Narrative  . No narrative on file     Filed Vitals:   12/16/13 0912  BP: 148/69  Pulse: 67  Height: 5\' 1"  (1.549 m)  Weight: 106 lb (48.081 kg)    PHYSICAL EXAM General: NAD Neck: No JVD, no thyromegaly. Lungs: Clear to auscultation bilaterally with normal respiratory effort. CV: Nondisplaced PMI.  Regular rate and rhythm, normal S1/S2,  no S3/S4, no murmur. No pretibial or periankle edema.  No carotid bruit.  Normal pedal pulses.  Abdomen: Soft, nontender, no hepatosplenomegaly, no distention.  Neurologic: Alert and oriented x 3.  Psych: Normal affect. Extremities: No clubbing or cyanosis.   ECG: reviewed and available in electronic records.      ASSESSMENT AND PLAN: 1. CAD: Symptomatically stable. No changes in medical therapy at this time. 2. HTN: Well controlled for age. 3. Hyperlipidemia: Continue statin therapy.  Dispo: f/u 6 months.  Prentice DockerSuresh Romulus Hanrahan, M.D., F.A.C.C.

## 2013-12-16 NOTE — Patient Instructions (Signed)
Your physician wants you to follow-up in: 6 months You will receive a reminder letter in the mail two months in advance. If you don't receive a letter, please call our office to schedule the follow-up appointment.     Your physician recommends that you continue on your current medications as directed. Please refer to the Current Medication list given to you today.      Thank you for choosing [facility] !        

## 2014-05-06 ENCOUNTER — Encounter: Payer: Self-pay | Admitting: Cardiovascular Disease

## 2014-05-12 ENCOUNTER — Emergency Department (HOSPITAL_COMMUNITY): Payer: Medicare Other

## 2014-05-12 ENCOUNTER — Encounter (HOSPITAL_COMMUNITY): Payer: Self-pay | Admitting: Emergency Medicine

## 2014-05-12 ENCOUNTER — Inpatient Hospital Stay (HOSPITAL_COMMUNITY)
Admission: EM | Admit: 2014-05-12 | Discharge: 2014-06-04 | DRG: 299 | Disposition: E | Payer: Medicare Other | Attending: Internal Medicine | Admitting: Internal Medicine

## 2014-05-12 DIAGNOSIS — I252 Old myocardial infarction: Secondary | ICD-10-CM

## 2014-05-12 DIAGNOSIS — Z8249 Family history of ischemic heart disease and other diseases of the circulatory system: Secondary | ICD-10-CM

## 2014-05-12 DIAGNOSIS — Z7901 Long term (current) use of anticoagulants: Secondary | ICD-10-CM | POA: Diagnosis not present

## 2014-05-12 DIAGNOSIS — R7309 Other abnormal glucose: Secondary | ICD-10-CM | POA: Diagnosis present

## 2014-05-12 DIAGNOSIS — I7101 Dissection of thoracic aorta: Secondary | ICD-10-CM

## 2014-05-12 DIAGNOSIS — N170 Acute kidney failure with tubular necrosis: Secondary | ICD-10-CM

## 2014-05-12 DIAGNOSIS — N289 Disorder of kidney and ureter, unspecified: Secondary | ICD-10-CM | POA: Diagnosis not present

## 2014-05-12 DIAGNOSIS — W19XXXA Unspecified fall, initial encounter: Secondary | ICD-10-CM | POA: Diagnosis present

## 2014-05-12 DIAGNOSIS — I2510 Atherosclerotic heart disease of native coronary artery without angina pectoris: Secondary | ICD-10-CM | POA: Diagnosis present

## 2014-05-12 DIAGNOSIS — Z955 Presence of coronary angioplasty implant and graft: Secondary | ICD-10-CM

## 2014-05-12 DIAGNOSIS — E875 Hyperkalemia: Secondary | ICD-10-CM | POA: Diagnosis present

## 2014-05-12 DIAGNOSIS — N184 Chronic kidney disease, stage 4 (severe): Secondary | ICD-10-CM | POA: Diagnosis present

## 2014-05-12 DIAGNOSIS — I129 Hypertensive chronic kidney disease with stage 1 through stage 4 chronic kidney disease, or unspecified chronic kidney disease: Secondary | ICD-10-CM | POA: Diagnosis present

## 2014-05-12 DIAGNOSIS — E872 Acidosis, unspecified: Secondary | ICD-10-CM | POA: Diagnosis present

## 2014-05-12 DIAGNOSIS — D72829 Elevated white blood cell count, unspecified: Secondary | ICD-10-CM | POA: Diagnosis present

## 2014-05-12 DIAGNOSIS — Z66 Do not resuscitate: Secondary | ICD-10-CM | POA: Diagnosis present

## 2014-05-12 DIAGNOSIS — J9600 Acute respiratory failure, unspecified whether with hypoxia or hypercapnia: Secondary | ICD-10-CM | POA: Diagnosis not present

## 2014-05-12 DIAGNOSIS — N179 Acute kidney failure, unspecified: Secondary | ICD-10-CM | POA: Diagnosis present

## 2014-05-12 DIAGNOSIS — I7100 Dissection of unspecified site of aorta: Secondary | ICD-10-CM | POA: Diagnosis present

## 2014-05-12 DIAGNOSIS — I312 Hemopericardium, not elsewhere classified: Secondary | ICD-10-CM | POA: Diagnosis present

## 2014-05-12 DIAGNOSIS — S0083XA Contusion of other part of head, initial encounter: Secondary | ICD-10-CM

## 2014-05-12 DIAGNOSIS — Z7902 Long term (current) use of antithrombotics/antiplatelets: Secondary | ICD-10-CM | POA: Diagnosis not present

## 2014-05-12 DIAGNOSIS — R079 Chest pain, unspecified: Secondary | ICD-10-CM | POA: Diagnosis present

## 2014-05-12 DIAGNOSIS — Z87891 Personal history of nicotine dependence: Secondary | ICD-10-CM

## 2014-05-12 DIAGNOSIS — I10 Essential (primary) hypertension: Secondary | ICD-10-CM | POA: Diagnosis present

## 2014-05-12 DIAGNOSIS — M549 Dorsalgia, unspecified: Secondary | ICD-10-CM | POA: Diagnosis present

## 2014-05-12 DIAGNOSIS — Z9861 Coronary angioplasty status: Secondary | ICD-10-CM | POA: Diagnosis not present

## 2014-05-12 DIAGNOSIS — Z7982 Long term (current) use of aspirin: Secondary | ICD-10-CM

## 2014-05-12 DIAGNOSIS — I7102 Dissection of abdominal aorta: Secondary | ICD-10-CM

## 2014-05-12 DIAGNOSIS — R296 Repeated falls: Secondary | ICD-10-CM

## 2014-05-12 DIAGNOSIS — I71019 Dissection of thoracic aorta, unspecified: Secondary | ICD-10-CM

## 2014-05-12 DIAGNOSIS — I719 Aortic aneurysm of unspecified site, without rupture: Secondary | ICD-10-CM

## 2014-05-12 DIAGNOSIS — Z515 Encounter for palliative care: Secondary | ICD-10-CM

## 2014-05-12 DIAGNOSIS — I7103 Dissection of thoracoabdominal aorta: Secondary | ICD-10-CM | POA: Diagnosis present

## 2014-05-12 LAB — CBC WITH DIFFERENTIAL/PLATELET
BASOS PCT: 0 % (ref 0–1)
Basophils Absolute: 0 10*3/uL (ref 0.0–0.1)
Eosinophils Absolute: 0 10*3/uL (ref 0.0–0.7)
Eosinophils Relative: 0 % (ref 0–5)
HCT: 37.7 % (ref 36.0–46.0)
Hemoglobin: 13.2 g/dL (ref 12.0–15.0)
Lymphocytes Relative: 3 % — ABNORMAL LOW (ref 12–46)
Lymphs Abs: 0.6 10*3/uL — ABNORMAL LOW (ref 0.7–4.0)
MCH: 31 pg (ref 26.0–34.0)
MCHC: 35 g/dL (ref 30.0–36.0)
MCV: 88.5 fL (ref 78.0–100.0)
Monocytes Absolute: 1.8 10*3/uL — ABNORMAL HIGH (ref 0.1–1.0)
Monocytes Relative: 10 % (ref 3–12)
Neutro Abs: 16.5 10*3/uL — ABNORMAL HIGH (ref 1.7–7.7)
Neutrophils Relative %: 87 % — ABNORMAL HIGH (ref 43–77)
Platelets: 195 10*3/uL (ref 150–400)
RBC: 4.26 MIL/uL (ref 3.87–5.11)
RDW: 13.1 % (ref 11.5–15.5)
WBC Morphology: INCREASED
WBC: 18.9 10*3/uL — ABNORMAL HIGH (ref 4.0–10.5)

## 2014-05-12 LAB — PROTIME-INR
INR: 1.84 — AB (ref 0.00–1.49)
PROTHROMBIN TIME: 21.3 s — AB (ref 11.6–15.2)

## 2014-05-12 LAB — URINALYSIS, ROUTINE W REFLEX MICROSCOPIC
GLUCOSE, UA: NEGATIVE mg/dL
HGB URINE DIPSTICK: NEGATIVE
Leukocytes, UA: NEGATIVE
Nitrite: NEGATIVE
PROTEIN: 100 mg/dL — AB
Specific Gravity, Urine: >1.03 — ABNORMAL HIGH (ref 1.005–1.030)
Urobilinogen, UA: 1 mg/dL (ref 0.0–1.0)
pH: 5 (ref 5.0–8.0)

## 2014-05-12 LAB — BASIC METABOLIC PANEL
Anion gap: 26 — ABNORMAL HIGH (ref 5–15)
BUN: 25 mg/dL — AB (ref 6–23)
CHLORIDE: 104 meq/L (ref 96–112)
CO2: 12 mEq/L — ABNORMAL LOW (ref 19–32)
Calcium: 9.9 mg/dL (ref 8.4–10.5)
Creatinine, Ser: 1.99 mg/dL — ABNORMAL HIGH (ref 0.50–1.10)
GFR calc Af Amer: 24 mL/min — ABNORMAL LOW (ref >90)
GFR, EST NON AFRICAN AMERICAN: 21 mL/min — AB (ref >90)
Glucose, Bld: 228 mg/dL — ABNORMAL HIGH (ref 70–99)
Potassium: 4.7 mEq/L (ref 3.7–5.3)
Sodium: 142 mEq/L (ref 137–147)

## 2014-05-12 LAB — TYPE AND SCREEN
ABO/RH(D): B POS
ANTIBODY SCREEN: NEGATIVE

## 2014-05-12 LAB — URINE MICROSCOPIC-ADD ON

## 2014-05-12 LAB — APTT: APTT: 42 s — AB (ref 24–37)

## 2014-05-12 LAB — TROPONIN I: Troponin I: <0.3 ng/mL (ref <0.30)

## 2014-05-12 MED ORDER — ONDANSETRON HCL 4 MG/2ML IJ SOLN
4.0000 mg | Freq: Once | INTRAMUSCULAR | Status: AC
  Administered 2014-05-12: 4 mg via INTRAVENOUS

## 2014-05-12 MED ORDER — SODIUM CHLORIDE 0.9 % IV SOLN
1000.0000 mL | INTRAVENOUS | Status: DC
  Administered 2014-05-12: 1000 mL via INTRAVENOUS

## 2014-05-12 MED ORDER — ESMOLOL HCL-SODIUM CHLORIDE 2000 MG/100ML IV SOLN
25.0000 ug/kg/min | INTRAVENOUS | Status: DC
  Administered 2014-05-12: 25 ug/kg/min via INTRAVENOUS
  Filled 2014-05-12: qty 100

## 2014-05-12 MED ORDER — ASPIRIN 300 MG RE SUPP: 300.0000 mg | RECTAL | Status: DC

## 2014-05-12 MED ORDER — ASPIRIN 81 MG PO CHEW: 324.0000 mg | CHEWABLE_TABLET | ORAL | Status: DC

## 2014-05-12 MED ORDER — ONDANSETRON HCL 4 MG/2ML IJ SOLN
INTRAMUSCULAR | Status: AC
Start: 2014-05-12 — End: 2014-05-12
  Administered 2014-05-12: 4 mg via INTRAVENOUS
  Filled 2014-05-12: qty 2

## 2014-05-12 MED ORDER — FENTANYL CITRATE 0.05 MG/ML IJ SOLN
INTRAMUSCULAR | Status: AC
  Filled 2014-05-12: qty 2

## 2014-05-12 MED ORDER — ESMOLOL HCL-SODIUM CHLORIDE 2000 MG/100ML IV SOLN
25.0000 ug/kg/min | Freq: Once | INTRAVENOUS | Status: AC
  Administered 2014-05-12: 25 ug/kg/min via INTRAVENOUS
  Filled 2014-05-12: qty 100

## 2014-05-12 MED ORDER — SODIUM CHLORIDE 0.9 % IV SOLN: 250.0000 mL | INTRAVENOUS | Status: DC | PRN

## 2014-05-12 MED ORDER — FENTANYL CITRATE 0.05 MG/ML IJ SOLN
25.0000 ug | Freq: Once | INTRAMUSCULAR | Status: AC
  Administered 2014-05-12: 25 ug via INTRAVENOUS

## 2014-05-12 MED ORDER — FENTANYL CITRATE 0.05 MG/ML IJ SOLN
25.0000 ug | Freq: Once | INTRAMUSCULAR | Status: AC
  Administered 2014-05-12: 25 ug via INTRAVENOUS
  Filled 2014-05-12: qty 2

## 2014-05-12 MED ORDER — MORPHINE SULFATE 2 MG/ML IJ SOLN
2.0000 mg | Freq: Once | INTRAMUSCULAR | Status: AC
  Administered 2014-05-12: 2 mg via INTRAVENOUS
  Filled 2014-05-12: qty 1

## 2014-05-12 MED ORDER — SODIUM CHLORIDE 0.9 % IJ SOLN: 3.0000 mL | INTRAMUSCULAR | Status: DC | PRN

## 2014-05-12 MED ORDER — FENTANYL CITRATE 0.05 MG/ML IJ SOLN
25.0000 ug | INTRAMUSCULAR | Status: DC | PRN
  Administered 2014-05-12 – 2014-05-13 (×3): 50 ug via INTRAVENOUS
  Filled 2014-05-12 (×3): qty 2

## 2014-05-12 MED ORDER — SODIUM CHLORIDE 0.9 % IJ SOLN
3.0000 mL | Freq: Two times a day (BID) | INTRAMUSCULAR | Status: DC
  Administered 2014-05-12 – 2014-05-13 (×3): 3 mL via INTRAVENOUS

## 2014-05-12 NOTE — ED Provider Notes (Signed)
CSN: [id number]     Arrival date & time 05/25/2014  1401 History  First MD Initiated Contact with Patient 05/23/2014 1500     Chief Complaint  Patient presents with  . Fall   HPI Patient presents today with an episode of weakness. The patient states when she woke up this morning about 3 AM she was feeling weak. His weakness was all over her body and not on one side versus the other.   Patient states she did have an episode of shortness of breath earlier but denies actually having any chest pain. Patient states she has fallen 4 times this morning because of her weakness. She did strike her head but denies any headache.  She denies any neck pain. She does have pain in her mid back. The pain worsens with deep breathing and movement.  She denies any nausea vomiting or abdominal pain. She did have some loose stools earlier in the day. She denies any dysuria. She has not noticed any bleeding.  Neighbors noted that she didn't pick up the paper. They called EMS to check on her. Patient was able to get to the window she did feel profoundly weak. Past Medical History  Diagnosis Date  . Coronary artery disease   . Hypertension   . Osteoporosis   . Myocardial infarction 06/2007    stent   Past Surgical History  Procedure Laterality Date  . Abdominal hysterectomy    . Cataract extraction Bilateral 1998  . Colonoscopy  03/2007    polyps repeat 5 years  . Dilation and curettage of uterus  1956   Family History  Problem Relation Age of Onset  . Hypertension Father   . Heart disease Father   . Hypertension Sister   . Cancer Sister   . Stroke Sister    History  Substance Use Topics  . Smoking status: Former Games developer  . Smokeless tobacco: Not on file  . Alcohol Use: No   OB History   Grav Para Term Preterm Abortions TAB SAB Ect Mult Living                 Review of Systems  All other systems reviewed and are negative.     Allergies  Review of patient's allergies indicates no known  allergies.  Home Medications   Prior to Admission medications   Medication Sig Start Date End Date Taking? Authorizing Provider  aspirin EC 81 MG tablet Take 81 mg by mouth every evening.   Yes Historical Provider, MD  clopidogrel (PLAVIX) 75 MG tablet Take 1 tablet (75 mg total) by mouth daily. 11/27/13  Yes Babs Sciara, MD  HYDROcodone-acetaminophen (NORCO/VICODIN) 5-325 MG per tablet Take 1-2 tablets by mouth every 4 (four) hours as needed for moderate pain.   Yes Historical Provider, MD  lisinopril (PRINIVIL,ZESTRIL) 5 MG tablet Take 5 mg by mouth daily.  11/27/13 11/27/14 Yes Babs Sciara, MD  metoprolol succinate (TOPROL-XL) 25 MG 24 hr tablet Take 0.5 tablets (12.5 mg total) by mouth daily as needed. 11/27/13  Yes Babs Sciara, MD  simvastatin (ZOCOR) 40 MG tablet TAKE 1 TABLET BY MOUTH EVERY DAY 11/27/13  Yes Babs Sciara, MD  vitamin E 200 UNIT capsule Take 200 Units by mouth daily.   Yes Historical Provider, MD  nitroGLYCERIN (NITROSTAT) 0.4 MG SL tablet Place 0.4 mg under the tongue every 5 (five) minutes as needed for chest pain.    Historical Provider, MD   BP 131/63  Pulse 91  Temp(Src) 97.3 F (36.3 C) (Oral)  Resp 22  Wt 105 lb (47.628 kg)  SpO2 100% Physical Exam  Nursing note and vitals reviewed. Constitutional: No distress.  Elderly, frail  HENT:  Head: Normocephalic.  Right Ear: External ear normal.  Left Ear: External ear normal.  Mouth/Throat: No oropharyngeal exudate.  Periorbital contusion right eye  Eyes: Conjunctivae are normal. Right eye exhibits no discharge. Left eye exhibits no discharge. No scleral icterus.  Neck: Neck supple. No tracheal deviation present.  Cardiovascular: Normal rate, regular rhythm and intact distal pulses.   Pulmonary/Chest: Effort normal and breath sounds normal. No stridor. No respiratory distress. She has no wheezes. She has no rales.  Abdominal: Soft. Bowel sounds are normal. She exhibits no distension. There is no  tenderness. There is no rebound and no guarding.  Musculoskeletal: She exhibits no edema.       Cervical back: She exhibits no tenderness, no bony tenderness and no swelling.       Thoracic back: She exhibits tenderness and bony tenderness. She exhibits no swelling.       Lumbar back: Normal.  Contusions to her extremities and lower extremities, full range of motion bilateral upper and lower extremities without pain or discomfort  Neurological: She is alert. No cranial nerve deficit (no facial droop, extraocular movements intact, no slurred speech) or sensory deficit. She exhibits normal muscle tone. She displays no seizure activity. Coordination normal.  The patient is able to hold both arms off the bed for 10 seconds, she is also able to hold each leg off the bed for 5 seconds, station is intact in all extremities, generalized weakness  Skin: Skin is warm and dry. No rash noted. There is pallor.  Psychiatric: She has a normal mood and affect.    ED Course  Procedures (including critical care time)  CRITICAL CARE Performed by: ZOXWR,UEA Total critical care time: 40 Critical care time was exclusive of separately billable procedures and treating other patients. Critical care was necessary to treat or prevent imminent or life-threatening deterioration. Critical care was time spent personally by me on the following activities: development of treatment plan with patient and/or surrogate as well as nursing, discussions with consultants, evaluation of patient's response to treatment, examination of patient, obtaining history from patient or surrogate, ordering and performing treatments and interventions, ordering and review of laboratory studies, ordering and review of radiographic studies, pulse oximetry and re-evaluation of patient's condition.  Labs Review Labs Reviewed  CBC WITH DIFFERENTIAL - Abnormal; Notable for the following:    WBC 18.9 (*)    Neutrophils Relative % 87 (*)    Neutro Abs  16.5 (*)    Lymphocytes Relative 3 (*)    Lymphs Abs 0.6 (*)    Monocytes Absolute 1.8 (*)    All other components within normal limits  BASIC METABOLIC PANEL - Abnormal; Notable for the following:    CO2 12 (*)    Glucose, Bld 228 (*)    BUN 25 (*)    Creatinine, Ser 1.99 (*)    GFR calc non Af Amer 21 (*)    GFR calc Af Amer 24 (*)    Anion gap 26 (*)    All other components within normal limits  APTT - Abnormal; Notable for the following:    aPTT 42 (*)    All other components within normal limits  PROTIME-INR - Abnormal; Notable for the following:    Prothrombin Time 21.3 (*)    INR 1.84 (*)  All other components within normal limits  TROPONIN I  URINALYSIS, ROUTINE W REFLEX MICROSCOPIC  TYPE AND SCREEN    Imaging Review Dg Chest 1 View  05/19/2014   CLINICAL DATA:  Larey Seat today.  Back pain.  EXAM: CHEST - 1 VIEW  COMPARISON:  06/01/2007  FINDINGS: Cardiac silhouette is mildly enlarged. There is enlargement of the thoracic aorta most evident at the level of the aortic knob. This is similar to the prior exam allowing for differences in patient positioning. No mediastinal or hilar masses.  The lungs are hyperexpanded. There is a relative paucity of markings in the upper lobes peripherally suggesting emphysema. No lung consolidation. No edema. No pleural effusion or pneumothorax.  Bony thorax is demineralized but grossly intact.  IMPRESSION: No acute cardiopulmonary disease.   Electronically Signed   By: Amie Portland M.D.   On: 05/10/2014 16:07   Dg Thoracic Spine 2 View  06/03/2014   CLINICAL DATA:  Followup back pain.  EXAM: THORACIC SPINE - 2 VIEW  COMPARISON:  None  FINDINGS: Cardiomegaly with a prominent transverse aorta.  Osteopenia.  Lateral view images from T2 through T12. Advanced spondylosis, without acute vertebral body height loss across these levels. T10 is borderline decreased in height, felt to be within normal variation.  IMPRESSION: Lower thoracic spondylosis, without  acute osseous abnormality. Approximately T10 is borderline decreased in height, favored to be within normal variation.  Incomplete evaluation of the upper thoracic spine.   Electronically Signed   By: Jeronimo Greaves M.D.   On: 05/28/2014 16:08   Ct Head Wo Contrast  05/06/2014   CLINICAL DATA:  Head trauma secondary to a fall. Bruising around the right eye.  EXAM: CT HEAD WITHOUT CONTRAST  CT CERVICAL SPINE WITHOUT CONTRAST  TECHNIQUE: Multidetector CT imaging of the head and cervical spine was performed following the standard protocol without intravenous contrast. Multiplanar CT image reconstructions of the cervical spine were also generated.  COMPARISON:  02/06/2012  FINDINGS: CT HEAD FINDINGS  No mass lesion. No midline shift. No acute hemorrhage or hematoma. No evidence of acute infarction. There is diffuse moderate cerebral cortical and cerebellar atrophy. Osseous structures are no normal except for slight mucosal thickening on the right side of the sphenoid sinus.  CT CERVICAL SPINE FINDINGS  There is no fracture or acute subluxation. There is a 2.7 mm subluxation of C7 on T1 which is felt to be due to degenerative facet arthritis. There is diffuse degenerative facet arthritis and degenerative disc disease in the cervical spine. No prevertebral soft tissue swelling. There is severe right facet arthritis at C1-2 with fairly extensive degenerative changes at the articulation of C1 with the odontoid process. There is prominent anterior soft tissue behind the body of C2 and the odontoid process which I suspect is degenerative in origin rather than epidural hemorrhage.  IMPRESSION: No acute intracranial abnormality.  Diffuse atrophy.  No acute abnormality of the cervical spine. Multilevel degenerative disc and joint disease.   Electronically Signed   By: Geanie Cooley M.D.   On: 05/25/2014 16:38   Ct Chest Wo Contrast  05/16/2014   CLINICAL DATA:  Status post fall.  Chest pain.  EXAM: CT CHEST WITHOUT CONTRAST   TECHNIQUE: Multidetector CT imaging of the chest was performed following the standard protocol without IV contrast.  COMPARISON:  Single view of the chest earlier this same day.  FINDINGS: The patient has an ascending aortic aneurysm measuring 4.8 cm in diameter. There is also an aortic dissection  extending from the aortic root across the aortic arch and down the thoracic aorta into the upper abdomen. The entire extent of dissection is likely not visualized. The patient has a moderate amount of hemopericardium. Heart size is enlarged. No axillary, hilar or mediastinal lymphadenopathy is identified. The lungs demonstrate some mild basilar scarring.  Incidentally imaged upper abdomen shows a small left adrenal nodule which is low attenuating compatible with an adenoma. No focal bony abnormality is identified.  IMPRESSION: The study is positive for type A aortic dissection extending down the thoracic aorta into the upper abdomen. Small to moderate hemopericardium is worrisome for rupture of the aorta at its root.  Ascending aortic aneurysm measuring 4.8 cm.  Critical Value/emergent results were called by telephone at the time of interpretation on 05/18/2014 at 4:38 pm to Dr. Linwood Dibbles , who verbally acknowledged these results.   Electronically Signed   By: Drusilla Kanner M.D.   On: May 15, 2014 16:38   Ct Cervical Spine Wo Contrast  May 15, 2014   CLINICAL DATA:  Head trauma secondary to a fall. Bruising around the right eye.  EXAM: CT HEAD WITHOUT CONTRAST  CT CERVICAL SPINE WITHOUT CONTRAST  TECHNIQUE: Multidetector CT imaging of the head and cervical spine was performed following the standard protocol without intravenous contrast. Multiplanar CT image reconstructions of the cervical spine were also generated.  COMPARISON:  02/06/2012  FINDINGS: CT HEAD FINDINGS  No mass lesion. No midline shift. No acute hemorrhage or hematoma. No evidence of acute infarction. There is diffuse moderate cerebral cortical and cerebellar  atrophy. Osseous structures are no normal except for slight mucosal thickening on the right side of the sphenoid sinus.  CT CERVICAL SPINE FINDINGS  There is no fracture or acute subluxation. There is a 2.7 mm subluxation of C7 on T1 which is felt to be due to degenerative facet arthritis. There is diffuse degenerative facet arthritis and degenerative disc disease in the cervical spine. No prevertebral soft tissue swelling. There is severe right facet arthritis at C1-2 with fairly extensive degenerative changes at the articulation of C1 with the odontoid process. There is prominent anterior soft tissue behind the body of C2 and the odontoid process which I suspect is degenerative in origin rather than epidural hemorrhage.  IMPRESSION: No acute intracranial abnormality.  Diffuse atrophy.  No acute abnormality of the cervical spine. Multilevel degenerative disc and joint disease.   Electronically Signed   By: Geanie Cooley M.D.   On: 05/30/2014 16:38     EKG Interpretation   Date/Time:  Tuesday May 15, 2014 14:14:51 EDT Ventricular Rate:  90 PR Interval:  164 QRS Duration: 74 QT Interval:  360 QTC Calculation: 440 R Axis:   48 Text Interpretation:  Normal sinus rhythm with sinus arrhythmia Junctional  ST depression, probably normal Borderline ECG Confirmed by Adriana Simas  MD, BRIAN  [phone]) on 15-May-2014 2:23:24 PM     Medications  0.9 %  sodium chloride infusion (1,000 mLs Intravenous New Bag/Given May 15, 2014 1527)  esmolol (BREVIBLOC) 2000 mg / 100 mL (20 mg/mL) infusion (not administered)  morphine 2 MG/ML injection 2 mg (2 mg Intravenous Given 05/28/2014 1539)   Coordination of care: Discussed the findings of the CT scan with the patient and her family.  The patient's CT scan shows a type A thoracic aortic aneurysm.  The patient remained stable at the bedside. Her heart rate is 91. Blood pressure is 131/63. I will start her on an esmolol drip.  I explain the severe life threatening  nature of this  illness. The patient does not have a living will but I did ask her about DO NOT RESUSCITATE status. Patient states she would not want to have CPR or be intubated. She does want to continue with treatment at this time.  I will consult with cardiothoracic surgery at [facility] for transfer and further treatment   MDM   Final diagnoses:  Aortic dissection, thoracic   I spoke with Dr Tyrone Sage.  He does not anticipate surgery considering her advanced age and risk factors.  He will see the patient at [facility] ED and discussed with patient and her family the treatment options.  Will have [place] [facility] transport to [facility] ED.   Linwood Dibbles, MD 05/27/14 [phone]

## 2014-05-12 NOTE — ED Notes (Signed)
Bolus of 562mcg/kg given over 1 min per order.

## 2014-05-12 NOTE — ED Notes (Signed)
Notified edp of recent vitals with MAP, Per Dr, Lynelle Doctor, if systolic pressure drops below 90, D/c esmolol.

## 2014-05-12 NOTE — ED Notes (Signed)
Family at bedside. 

## 2014-05-12 NOTE — H&P (Signed)
PULMONARY / CRITICAL CARE MEDICINE   Name: Julia Werner MRN: [id number] DOB: 1921/07/30    ADMISSION DATE:  05/21/2014 CONSULTATION DATE:  05/18/2014  REFERRING MD :  EDP  CHIEF COMPLAINT:  Back pain and weakness  INITIAL PRESENTATION: 80 y.o. F brought to [facility] ED on 9/8 with weakness and back pain which woke her from her sleep at 3AM .  In ED, found to have acute aortic dissection.  [facility] Tyrone Sage) was consulted and deemed that pt was not a surgical candidate, recommended medical management.  Pt is DNR / DNI.  No SDU beds available therefore PCCM was consulted for admission and titration of esmolol gtt.  STUDIES:  CXR 9/8 >>> no acute process T-Spine 9/8 >>> lower thoracic spondylosis. CT Head 9/8 >>> no acute intracranial abrnormality CT Chest 9/8 >>> positive for type A aortic dissection extending from thoracic aorta into upper abdomen, small to moderate hemopericardium is worrisome for rupture of the aorta at it's root.  Ascending aortic aneurysm measuring 4.8cm.  SIGNIFICANT EVENTS: 9/8 - presented to [facility] ED, found to have acute type A aortic dissection, transferred to [facility] for medical management (deemed to not be a surgical candidate).   HISTORY OF PRESENT ILLNESS:   Julia Werner is a 80 y.o. F with PMH as outlined below who presented to [facility] ED on 9/8 with weakness that she began to experience after she was suddenly awakened from her sleep at 3AM due to moderate back pain, located thoracic area.  After she woke up, she did have an episode of shortness of breath and perhaps some chest tightness.  After getting out of bed, she proceeded to fall 4 times due to weakness. In ED, imaging was obtained as outlined above and pt was found to have acute aortic dissection.  Dr. Tyrone Sage of CVTS was consulted and he deemed that pt was not a surgical candidate due to her age and frail condition.  Furthermore, pt is DNR / DNI status.  He recommended medical management with titration of esmolol gtt to keep  SBP < 130 and possible palliative care consult. PCCM was consulted for admission.  PAST MEDICAL HISTORY :  Past Medical History  Diagnosis Date  . Coronary artery disease   . Hypertension   . Osteoporosis   . Myocardial infarction 06/2007    stent   Past Surgical History  Procedure Laterality Date  . Abdominal hysterectomy    . Cataract extraction Bilateral 1998  . Colonoscopy  03/2007    polyps repeat 5 years  . Dilation and curettage of uterus  1956   Prior to Admission medications   Medication Sig Start Date End Date Taking? Authorizing Provider  aspirin EC 81 MG tablet Take 81 mg by mouth every evening.   Yes Historical Provider, MD  clopidogrel (PLAVIX) 75 MG tablet Take 1 tablet (75 mg total) by mouth daily. 11/27/13  Yes Babs Sciara, MD  HYDROcodone-acetaminophen (NORCO/VICODIN) 5-325 MG per tablet Take 1-2 tablets by mouth every 4 (four) hours as needed for moderate pain.   Yes Historical Provider, MD  lisinopril (PRINIVIL,ZESTRIL) 5 MG tablet Take 5 mg by mouth daily.  11/27/13 11/27/14 Yes Babs Sciara, MD  metoprolol succinate (TOPROL-XL) 25 MG 24 hr tablet Take 0.5 tablets (12.5 mg total) by mouth daily as needed. 11/27/13  Yes Babs Sciara, MD  simvastatin (ZOCOR) 40 MG tablet TAKE 1 TABLET BY MOUTH EVERY DAY 11/27/13  Yes Babs Sciara, MD  vitamin E 200 UNIT capsule  Take 200 Units by mouth daily.   Yes Historical Provider, MD  nitroGLYCERIN (NITROSTAT) 0.4 MG SL tablet Place 0.4 mg under the tongue every 5 (five) minutes as needed for chest pain.    Historical Provider, MD   No Known Allergies  FAMILY HISTORY:  Family History  Problem Relation Age of Onset  . Hypertension Father   . Heart disease Father   . Hypertension Sister   . Cancer Sister   . Stroke Sister    SOCIAL HISTORY:  reports that she has quit smoking. She does not have any smokeless tobacco history on file. She reports that she does not drink alcohol or use illicit drugs.  REVIEW OF  SYSTEMS:   All negative; except for those that are bolded, which indicate positives.  Constitutional: weight loss, weight gain, night sweats, fevers, chills, fatigue, weakness.  HEENT: headaches, sore throat, sneezing, nasal congestion, post nasal drip, difficulty swallowing, tooth/dental problems, visual complaints, visual changes, ear aches. Neuro: difficulty with speech, weakness, numbness, ataxia. CV:  chest pain, orthopnea, PND, swelling in lower extremities, dizziness, palpitations, syncope. Resp: cough, hemoptysis, dyspnea, wheezing. GI  heartburn, indigestion, abdominal pain, nausea, vomiting, diarrhea, constipation, change in bowel habits, loss of appetite, hematemesis, melena, hematochezia.  GU: dysuria, change in color of urine, urgency or frequency, flank pain, hematuria. MSK: joint pain or swelling, decreased range of motion, back pain. Psych: change in mood or affect, depression, anxiety, suicidal ideations, homicidal ideations. Skin: rash, itching, bruising.   SUBJECTIVE:  Complaining of back pain and mild chest pain.    VITAL SIGNS: Temp:  [97.3 F (36.3 C)] 97.3 F (36.3 C) (09/08 1408) Pulse Rate:  [81-91] 85 (09/08 1720) Resp:  [16-32] 21 (09/08 2000) BP: (93-140)/(58-92) 131/92 mmHg (09/08 2000) SpO2:  [98 %-100 %] 98 % (09/08 1720) Weight:  [47.628 kg (105 lb)] 47.628 kg (105 lb) (09/08 1408) HEMODYNAMICS:   VENTILATOR SETTINGS:   INTAKE / OUTPUT: Intake/Output   None     PHYSICAL EXAMINATION: General: Very pleasant elderly female, in NAD. Neuro: A&O x 3, non-focal.  HEENT: [place]/AT. PERRL, sclerae anicteric. Cardiovascular: RRR, ? Aortic regurg murmur. Lungs: Respirations even and unlabored.  CTA bilaterally, No W/R/R. Abdomen: BS x 4, soft, NT/ND.  Musculoskeletal: No gross deformities, no edema.  Skin: Intact, warm, no rashes.  LABS:  CBC  Recent Labs Lab May 13, 2014 1419  WBC 18.9*  HGB 13.2  HCT 37.7  PLT 195   Coag's  Recent Labs Lab  2014-05-13 1319  APTT 42*  INR 1.84*   BMET  Recent Labs Lab May 13, 2014 1419  NA 142  K 4.7  CL 104  CO2 12*  BUN 25*  CREATININE 1.99*  GLUCOSE 228*   Electrolytes  Recent Labs Lab May 13, 2014 1419  CALCIUM 9.9   Sepsis Markers No results found for this basename: LATICACIDVEN, PROCALCITON, O2SATVEN,  in the last 168 hours ABG No results found for this basename: PHART, PCO2ART, PO2ART,  in the last 168 hours Liver Enzymes No results found for this basename: AST, ALT, ALKPHOS, BILITOT, ALBUMIN,  in the last 168 hours Cardiac Enzymes  Recent Labs Lab 05/13/14 1419  TROPONINI <0.30   Glucose No results found for this basename: GLUCAP,  in the last 168 hours  Imaging Dg Chest 1 View  05/13/2014   CLINICAL DATA:  Larey Seat today.  Back pain.  EXAM: CHEST - 1 VIEW  COMPARISON:  06/01/2007  FINDINGS: Cardiac silhouette is mildly enlarged. There is enlargement of the thoracic aorta most evident  at the level of the aortic knob. This is similar to the prior exam allowing for differences in patient positioning. No mediastinal or hilar masses.  The lungs are hyperexpanded. There is a relative paucity of markings in the upper lobes peripherally suggesting emphysema. No lung consolidation. No edema. No pleural effusion or pneumothorax.  Bony thorax is demineralized but grossly intact.  IMPRESSION: No acute cardiopulmonary disease.   Electronically Signed   By: Amie Portland M.D.   On: 05/21/2014 16:07   Dg Thoracic Spine 2 View  05/27/2014   CLINICAL DATA:  Followup back pain.  EXAM: THORACIC SPINE - 2 VIEW  COMPARISON:  None  FINDINGS: Cardiomegaly with a prominent transverse aorta.  Osteopenia.  Lateral view images from T2 through T12. Advanced spondylosis, without acute vertebral body height loss across these levels. T10 is borderline decreased in height, felt to be within normal variation.  IMPRESSION: Lower thoracic spondylosis, without acute osseous abnormality. Approximately T10 is  borderline decreased in height, favored to be within normal variation.  Incomplete evaluation of the upper thoracic spine.   Electronically Signed   By: Jeronimo Greaves M.D.   On: 05/23/2014 16:08   Ct Head Wo Contrast  05/18/2014   CLINICAL DATA:  Head trauma secondary to a fall. Bruising around the right eye.  EXAM: CT HEAD WITHOUT CONTRAST  CT CERVICAL SPINE WITHOUT CONTRAST  TECHNIQUE: Multidetector CT imaging of the head and cervical spine was performed following the standard protocol without intravenous contrast. Multiplanar CT image reconstructions of the cervical spine were also generated.  COMPARISON:  02/06/2012  FINDINGS: CT HEAD FINDINGS  No mass lesion. No midline shift. No acute hemorrhage or hematoma. No evidence of acute infarction. There is diffuse moderate cerebral cortical and cerebellar atrophy. Osseous structures are no normal except for slight mucosal thickening on the right side of the sphenoid sinus.  CT CERVICAL SPINE FINDINGS  There is no fracture or acute subluxation. There is a 2.7 mm subluxation of C7 on T1 which is felt to be due to degenerative facet arthritis. There is diffuse degenerative facet arthritis and degenerative disc disease in the cervical spine. No prevertebral soft tissue swelling. There is severe right facet arthritis at C1-2 with fairly extensive degenerative changes at the articulation of C1 with the odontoid process. There is prominent anterior soft tissue behind the body of C2 and the odontoid process which I suspect is degenerative in origin rather than epidural hemorrhage.  IMPRESSION: No acute intracranial abnormality.  Diffuse atrophy.  No acute abnormality of the cervical spine. Multilevel degenerative disc and joint disease.   Electronically Signed   By: Geanie Cooley M.D.   On: 06/03/2014 16:38   Ct Chest Wo Contrast  05/31/2014   CLINICAL DATA:  Status post fall.  Chest pain.  EXAM: CT CHEST WITHOUT CONTRAST  TECHNIQUE: Multidetector CT imaging of the chest  was performed following the standard protocol without IV contrast.  COMPARISON:  Single view of the chest earlier this same day.  FINDINGS: The patient has an ascending aortic aneurysm measuring 4.8 cm in diameter. There is also an aortic dissection extending from the aortic root across the aortic arch and down the thoracic aorta into the upper abdomen. The entire extent of dissection is likely not visualized. The patient has a moderate amount of hemopericardium. Heart size is enlarged. No axillary, hilar or mediastinal lymphadenopathy is identified. The lungs demonstrate some mild basilar scarring.  Incidentally imaged upper abdomen shows a small left adrenal nodule which  is low attenuating compatible with an adenoma. No focal bony abnormality is identified.  IMPRESSION: The study is positive for type A aortic dissection extending down the thoracic aorta into the upper abdomen. Small to moderate hemopericardium is worrisome for rupture of the aorta at its root.  Ascending aortic aneurysm measuring 4.8 cm.  Critical Value/emergent results were called by telephone at the time of interpretation on 06/01/2014 at 4:38 pm to Dr. Linwood Dibbles , who verbally acknowledged these results.   Electronically Signed   By: Drusilla Kanner M.D.   On: 06/01/14 16:38   Ct Cervical Spine Wo Contrast  06-01-2014   CLINICAL DATA:  Head trauma secondary to a fall. Bruising around the right eye.  EXAM: CT HEAD WITHOUT CONTRAST  CT CERVICAL SPINE WITHOUT CONTRAST  TECHNIQUE: Multidetector CT imaging of the head and cervical spine was performed following the standard protocol without intravenous contrast. Multiplanar CT image reconstructions of the cervical spine were also generated.  COMPARISON:  02/06/2012  FINDINGS: CT HEAD FINDINGS  No mass lesion. No midline shift. No acute hemorrhage or hematoma. No evidence of acute infarction. There is diffuse moderate cerebral cortical and cerebellar atrophy. Osseous structures are no normal except  for slight mucosal thickening on the right side of the sphenoid sinus.  CT CERVICAL SPINE FINDINGS  There is no fracture or acute subluxation. There is a 2.7 mm subluxation of C7 on T1 which is felt to be due to degenerative facet arthritis. There is diffuse degenerative facet arthritis and degenerative disc disease in the cervical spine. No prevertebral soft tissue swelling. There is severe right facet arthritis at C1-2 with fairly extensive degenerative changes at the articulation of C1 with the odontoid process. There is prominent anterior soft tissue behind the body of C2 and the odontoid process which I suspect is degenerative in origin rather than epidural hemorrhage.  IMPRESSION: No acute intracranial abnormality.  Diffuse atrophy.  No acute abnormality of the cervical spine. Multilevel degenerative disc and joint disease.   Electronically Signed   By: Geanie Cooley M.D.   On: Jun 01, 2014 16:38     ASSESSMENT / PLAN:  PULMONARY A: Hypoxic respiratory failure DNI Status P:   Supplemental O2 to maintain SpO2 > 92%.  CARDIOVASCULAR A:  Acute type A aortic dissection CAD s/p LAD stent on plavix Hx HTN DNR Status P:  Esmolol gtt PRN to maintain SBP < 130 per Dr. Dennie Maizes recs. Fentanyl 25-66mcg q2hrs PRN for pain. Consider palliative care consult in AM. Hold outpatient aspirin, plavix, lisinopril, toprol xl, simvastatin, nitro.  RENAL A:   Acute Renal insufficiency - Creat % at admit (baseline 1.1mg %) AG metabolic acidosis P:   NS @ 75. BMP in AM.  GASTROINTESTINAL A:   Nutrition P:   Advance diet as tolerated.  HEMATOLOGIC A:   VTE Prophylaxis P:  SCD's only. CBC in AM. Hold outpatient plavix.  INFECTIOUS A:   Leukocytosis with left shift - infectious vs stress response / acute phase reactant. P:   Given the fact that pt has acute aortic dissection with poor prognosis, would not pursue further workup at this point as anticipate moving to comfort measures in  near future.  ENDOCRINE A:   Hyperglycemia P:   Monitor glucose on BMP.  NEUROLOGIC A:   No acute issues P:   No intervention required. Rutherford Guys, PA - [facility] Pgr: [phone]  or [phone] 2014/06/01, 8:34  PM    STAFF NOTE: I, Dr Lavinia Sharps have personally reviewed patient's available data, including medical history, events of note, physical examination and test results as part of my evaluation. I have discussed with resident/NP and other care providers such as pharmacist, RN and RRT.  In addition,  I personally evaluated patient and elicited key findings of  80 y.o. F with acute type A aortic dissection with aortic dissection.  Deemed not a surgical candidate by CVTS.  Admitting to ICU for strict BP control via titration of esmolol gtt. For sbp < 130.  Will not initiate pressors. DNAR , DNAI.  Fentanyl PRN for pain.  May need to move forward with comfort measures in the near future if deteriorates. Suspect this could be a terminal event. Family of nephew and niece and grand nephew updated at bedside.  Rest per NP/medical resident whose note is outlined above and that I agree with  The patient is critically ill with multiple organ systems failure and requires high complexity decision making for assessment and support, frequent evaluation and titration of therapies, application of advanced monitoring technologies and extensive interpretation of multiple databases.   Critical Care Time devoted to patient care services described in this note is  35  Minutes.  Dr. Kalman Shan, M.D., [facility].C.P Pulmonary and Critical Care Medicine Staff Physician [facility] Pager: [phone], If no answer or between  15:00h - 7:00h: call [phone]  14-May-2014 10:58 PM

## 2014-05-12 NOTE — ED Notes (Signed)
Dr. Tyrone Sage paged about arrival of pt to ED.

## 2014-05-12 NOTE — Consult Note (Signed)
[address]       [place] [address]             [phone]        Julia Werner [facility] Medical Record #[id number] Date of Birth: 05/04/1921  Referring: Dr Lynelle Doctor, [facility] Primary Care: Lilyan Punt, MD  Chief Complaint:    Chief Complaint  Patient presents with  . Fall    History of Present Illness:     Patient presented to [facility] 2pm today with an episode of weakness. The patient  woke up this morning about 3 AM she was feeling weak.  Generalized weakness not laterlizing. Patient states she did have an episode of shortness of breath earlier but denies actually having any chest pain. Patient states she has fallen 4 times this morning because of her weakness. She did strike her head but denies any headache. She denies any neck pain. She does have pain in her mid back. The pain is not as severe as when she had mi and stent placed.    Current Activity/ Functional Status: Patient was independent with mobility/ambulation, transfers, ADL's, IADL's.   Patient was living alone   Zubrod Score: At the time of surgery this patient's most appropriate activity status/level should be described as:     0    Normal activity, no symptoms     1    Restricted in physical strenuous activity but ambulatory, able to do out light work     2    Ambulatory and capable of self care, unable to do work activities, up and about                 more than 50%  Of the time                                3    Only limited self care, in bed greater than 50% of waking hours     4    Completely disabled, no self care, confined to bed or chair     5    Moribund  Past Medical History  Diagnosis Date  . Coronary artery disease   . Hypertension   . Osteoporosis   . Myocardial infarction 06/2007    stent    Past Surgical History  Procedure Laterality Date  . Abdominal hysterectomy    . Cataract extraction Bilateral 1998  . Colonoscopy  03/2007    polyps repeat 5  years  . Dilation and curettage of uterus  1956    History  Smoking status  . Former Smoker  Smokeless tobacco  . Not on file   History  Alcohol Use No    History   Social History  . Marital Status: Widowed    Spouse Name: N/A    Number of Children: One child died at age 28  With spina bifida   . Years of Education: N/A   Occupational History  . Not on file.   Social History Main Topics  . Smoking status: Former Games developer  . Smokeless tobacco: Not on file  . Alcohol Use: No  . Drug Use: No  . Sexual Activity: Not on file   .   No Known Allergies  Current Facility-Administered Medications  Medication Dose Route Frequency Provider Last Rate Last Dose  . 0.9 %  sodium chloride infusion  1,000 mL Intravenous Continuous Cletis Athens  Lynelle Doctor, MD 125 mL/hr at 06/03/2014 1527 1,000 mL at 06/01/2014 1527   Current Outpatient Prescriptions  Medication Sig Dispense Refill  . aspirin EC 81 MG tablet Take 81 mg by mouth every evening.      . clopidogrel (PLAVIX) 75 MG tablet Take 1 tablet (75 mg total) by mouth daily.  90 tablet  1  . HYDROcodone-acetaminophen (NORCO/VICODIN) 5-325 MG per tablet Take 1-2 tablets by mouth every 4 (four) hours as needed for moderate pain.      Marland Kitchen lisinopril (PRINIVIL,ZESTRIL) 5 MG tablet Take 5 mg by mouth daily.       . metoprolol succinate (TOPROL-XL) 25 MG 24 hr tablet Take 0.5 tablets (12.5 mg total) by mouth daily as needed.  45 tablet  1  . simvastatin (ZOCOR) 40 MG tablet TAKE 1 TABLET BY MOUTH EVERY DAY  90 tablet  1  . vitamin E 200 UNIT capsule Take 200 Units by mouth daily.      . nitroGLYCERIN (NITROSTAT) 0.4 MG SL tablet Place 0.4 mg under the tongue every 5 (five) minutes as needed for chest pain.          Family History  Problem Relation Age of Onset  . Hypertension Father   . Heart disease Father   . Hypertension Sister   . Cancer Sister   . Stroke Sister      Review of Systems:     Cardiac Review of Systems: Y or N  Chest Pain [ y    ]  Resting SOB [n   ] Exertional SOB  Cove.Etienne  ]  Kenese.Mounts [  ]   Pedal Edema [n   ]    Palpitations Milo.Brash  ] Syncope  [n  ]   Presyncope [ y  ]  General Review of Systems: [Y] = yes [  ]=no Constitional: recent weight change [  ]; anorexia [  ]; fatigue [ y ]; nausea [  ]; night sweats [  ]; fever [ n ]; or chills [n  ]                                                               Dental: poor dentition[  ]; Last Dentist visit:   Eye : blurred vision [n  ]; diplopia [   ]; vision changes [  ];  Amaurosis fugax[  ]; Resp: cough [  ];  wheezing[  ];  hemoptysis[  ]; shortness of breath[ y ]; paroxysmal nocturnal dyspnea[  ]; dyspnea on exertion[y  ]; or orthopnea[  ];  GI:  gallstones[  ], vomiting[  ];  dysphagia[  ]; melena[  ];  hematochezia [  ]; heartburn[  ];   Hx of  Colonoscopy[  ]; GU: kidney stones [  ]; hematuria[  ];   dysuria [  ];  nocturia[  ];  history of     obstruction [  ]; urinary frequency [  ]             Skin: rash, swelling[  ];, hair loss[  ];  peripheral edema[  ];  or itching[  ]; Musculosketetal: myalgias[  ];  joint swelling[  ];  joint erythema[ y ];  joint pain[y  ];  back pain[y  ];  Heme/Lymph: bruising[  ];  bleeding[  ];  anemia[  ];  Neuro: TIA[ n ];  headaches[ n ];  stroke[  ];  vertigo[y  ];  seizures[  ];   paresthesias[  ];  difficulty walking[y  ];  Psych:depression[  ]; anxiety[  ];  Endocrine: diabetes[  ];  thyroid dysfunction[  ];  Immunizations: Flu [  ]; Pneumococcal[  ];  Other:  Physical Exam: BP 107/58  Pulse 85  Temp(Src) 97.3 F (36.3 C) (Oral)  Resp 32  Ht  (1.549 m)  Wt 105 lb (47.628 kg)  BMI 19.85 kg/m2  SpO2 98%  General appearance: cooperative, appears stated age, cachectic, mild distress and patient able to have a lucid conversation, aware of current situation Neurologic: intact and moves lower extremities   Heart: regular rate and rhythm and systolic murmur: early systolic 3/6, blowing at lower left sternal border Lungs:  diminished breath sounds bilaterally Abdomen: soft, non-tender; bowel sounds normal; no masses,  no organomegaly Extremities: extremities normal, atraumatic, no cyanosis or edema and brussed both arms and legs from falls at home  Wound: brusing around the right eye patient is very frail appearing   Diagnostic Studies & Laboratory data:     Recent Radiology Findings:   Dg Chest 1 View  05/23/2014   CLINICAL DATA:  Larey Seat today.  Back pain.  EXAM: CHEST - 1 VIEW  COMPARISON:  06/01/2007  FINDINGS: Cardiac silhouette is mildly enlarged. There is enlargement of the thoracic aorta most evident at the level of the aortic knob. This is similar to the prior exam allowing for differences in patient positioning. No mediastinal or hilar masses.  The lungs are hyperexpanded. There is a relative paucity of markings in the upper lobes peripherally suggesting emphysema. No lung consolidation. No edema. No pleural effusion or pneumothorax.  Bony thorax is demineralized but grossly intact.  IMPRESSION: No acute cardiopulmonary disease.   Electronically Signed   By: Amie Portland M.D.   On: 05/19/2014 16:07   Dg Thoracic Spine 2 View  05/11/2014   CLINICAL DATA:  Followup back pain.  EXAM: THORACIC SPINE - 2 VIEW  COMPARISON:  None  FINDINGS: Cardiomegaly with a prominent transverse aorta.  Osteopenia.  Lateral view images from T2 through T12. Advanced spondylosis, without acute vertebral body height loss across these levels. T10 is borderline decreased in height, felt to be within normal variation.  IMPRESSION: Lower thoracic spondylosis, without acute osseous abnormality. Approximately T10 is borderline decreased in height, favored to be within normal variation.  Incomplete evaluation of the upper thoracic spine.   Electronically Signed   By: Jeronimo Greaves M.D.   On: 05/07/2014 16:08   Ct Head Wo Contrast  05/06/2014   CLINICAL DATA:  Head trauma secondary to a fall. Bruising around the right eye.  EXAM: CT HEAD WITHOUT  CONTRAST  CT CERVICAL SPINE WITHOUT CONTRAST  TECHNIQUE: Multidetector CT imaging of the head and cervical spine was performed following the standard protocol without intravenous contrast. Multiplanar CT image reconstructions of the cervical spine were also generated.  COMPARISON:  02/06/2012  FINDINGS: CT HEAD FINDINGS  No mass lesion. No midline shift. No acute hemorrhage or hematoma. No evidence of acute infarction. There is diffuse moderate cerebral cortical and cerebellar atrophy. Osseous structures are no normal except for slight mucosal thickening on the right side of the sphenoid sinus.  CT CERVICAL SPINE FINDINGS  There is no fracture or acute subluxation. There is a 2.7 mm subluxation of C7 on T1 which is  felt to be due to degenerative facet arthritis. There is diffuse degenerative facet arthritis and degenerative disc disease in the cervical spine. No prevertebral soft tissue swelling. There is severe right facet arthritis at C1-2 with fairly extensive degenerative changes at the articulation of C1 with the odontoid process. There is prominent anterior soft tissue behind the body of C2 and the odontoid process which I suspect is degenerative in origin rather than epidural hemorrhage.  IMPRESSION: No acute intracranial abnormality.  Diffuse atrophy.  No acute abnormality of the cervical spine. Multilevel degenerative disc and joint disease.   Electronically Signed   By: Geanie Cooley M.D.   On: May 18, 2014 16:38   Ct Chest Wo Contrast  May 18, 2014   CLINICAL DATA:  Status post fall.  Chest pain.  EXAM: CT CHEST WITHOUT CONTRAST  TECHNIQUE: Multidetector CT imaging of the chest was performed following the standard protocol without IV contrast.  COMPARISON:  Single view of the chest earlier this same day.  FINDINGS: The patient has an ascending aortic aneurysm measuring 4.8 cm in diameter. There is also an aortic dissection extending from the aortic root across the aortic arch and down the thoracic aorta into  the upper abdomen. The entire extent of dissection is likely not visualized. The patient has a moderate amount of hemopericardium. Heart size is enlarged. No axillary, hilar or mediastinal lymphadenopathy is identified. The lungs demonstrate some mild basilar scarring.  Incidentally imaged upper abdomen shows a small left adrenal nodule which is low attenuating compatible with an adenoma. No focal bony abnormality is identified.  IMPRESSION: The study is positive for type A aortic dissection extending down the thoracic aorta into the upper abdomen. Small to moderate hemopericardium is worrisome for rupture of the aorta at its root.  Ascending aortic aneurysm measuring 4.8 cm.  Critical Value/emergent results were called by telephone at the time of interpretation on 18-May-2014 at 4:38 pm to Dr. Linwood Dibbles , who verbally acknowledged these results.   Electronically Signed   By: Drusilla Kanner M.D.   On: 05-18-2014 16:38   Ct Cervical Spine Wo Contrast  2014/05/18   CLINICAL DATA:  Head trauma secondary to a fall. Bruising around the right eye.  EXAM: CT HEAD WITHOUT CONTRAST  CT CERVICAL SPINE WITHOUT CONTRAST  TECHNIQUE: Multidetector CT imaging of the head and cervical spine was performed following the standard protocol without intravenous contrast. Multiplanar CT image reconstructions of the cervical spine were also generated.  COMPARISON:  02/06/2012  FINDINGS: CT HEAD FINDINGS  No mass lesion. No midline shift. No acute hemorrhage or hematoma. No evidence of acute infarction. There is diffuse moderate cerebral cortical and cerebellar atrophy. Osseous structures are no normal except for slight mucosal thickening on the right side of the sphenoid sinus.  CT CERVICAL SPINE FINDINGS  There is no fracture or acute subluxation. There is a 2.7 mm subluxation of C7 on T1 which is felt to be due to degenerative facet arthritis. There is diffuse degenerative facet arthritis and degenerative disc disease in the cervical  spine. No prevertebral soft tissue swelling. There is severe right facet arthritis at C1-2 with fairly extensive degenerative changes at the articulation of C1 with the odontoid process. There is prominent anterior soft tissue behind the body of C2 and the odontoid process which I suspect is degenerative in origin rather than epidural hemorrhage.  IMPRESSION: No acute intracranial abnormality.  Diffuse atrophy.  No acute abnormality of the cervical spine. Multilevel degenerative disc and joint disease.  Electronically Signed   By: Geanie Cooley M.D.   On: 05/07/2014 16:38      Recent Lab Findings: Lab Results  Component Value Date   WBC 18.9* 06/02/2014   HGB 13.2 05/19/2014   HCT 37.7 05/22/2014   PLT 195 05/13/2014   GLUCOSE 228* 05/20/2014   CHOL 139 06/16/2013   TRIG 98 06/16/2013   HDL 78 06/16/2013   LDLCALC 41 06/16/2013   ALT 22 04/28/2013   AST 26 04/28/2013   NA 142 05/13/2014   K 4.7 05/10/2014   CL 104 05/08/2014   CREATININE 1.99* 05/29/2014   BUN 25* 05/11/2014   CO2 12* 05/28/2014   TSH 5.111* 06/16/2013   INR 1.84* 05/16/2014   Chronic Kidney Disease   Stage I     GFR >90  Stage II    GFR 60-89  Stage IIIA GFR 45-59  Stage IIIB GFR 30-44  Stage IV   GFR 15-29  Stage V    GFR  <15  Lab Results  Component Value Date   CREATININE 1.99* 05/28/2014   Estimated Creatinine Clearance: 13.8 ml/min (by C-G formula based on Cr of 1.99).    Assessment / Plan:   Acute aortic dissection, probable Type A, but no contrast given with CT  Chronic renal disease, Stage IV estimate  Known CAD, s/p LAD stent on Plavix With the patients age and very frail condition would not recommend any operative treatment. Would recommend supportive medical /comfort care. Patient has expressed that she does not wish mechanical support, vent, cpr, dialysis. She is very lucid during this conversation.  Consider palat ive care consult Avoid hypertension, treat hypertension to keep systolic BP less then 130, would not  instigate pressors to treat hypotension as natural  history may be aortic rupture and sudden death or progressive renal failure.     Delight Ovens MD      [address] [place] [place] [address] Office [phone]   Beeper [phone]  05/17/2014 7:03 PM

## 2014-05-12 NOTE — ED Notes (Signed)
Pt reports woke up at 0300 this morning with cp, states got up to bathroom and fell.  Reports has fallen 4 times since 0300 this morning.  Unsure if hit head, unknown LOC.  Pt's neighbors called EMS due to not seeing pt come outside to get her paper.  Upon arrival, pt was found lying half way on bed.  Pt has bruising to right eye and right elbow.  C-collar in place.  C/o mid back pain, worse with deep breath.  Pt alert and oriented x 4 at this time.

## 2014-05-12 NOTE — ED Notes (Signed)
Dr. Gerhardt at bedside 

## 2014-05-12 NOTE — ED Notes (Signed)
Pt c/o severe back pain, requesting something for pain.  edp notified.  Orders received.

## 2014-05-12 NOTE — ED Provider Notes (Signed)
Pt transferred from [facility]. Pt had chest and back pain, noted to have dissection. Transferred to our EDP, AOx[facility]   Filed Vitals:   05/29/2014 1900  BP: 121/62  Pulse:   Temp:   Resp: 25   HR is in the 70s-80s.  Dr. Tyrone Sage saw the patient. Pt has decided not to get surgical management. She in fact doesn't want dialysis, intubation - and her code status now is DNR, DNI. Step down admission recommended, with medical management.  7:56 PM No step down bends available and step down cannot titrate to HR or BP (i.e. No esmolol, labetalol allowed). Will have to admit to ICU.  Derwood Kaplan, MD 05/26/2014 [phone]

## 2014-05-12 NOTE — ED Notes (Signed)
Esmolol stopped on arrival to [facility].  BP 123/68, HR 78.

## 2014-05-12 NOTE — ED Notes (Signed)
Admitting at bedside 

## 2014-05-12 NOTE — ED Notes (Signed)
Pt started experiencing severe nausea, meds given.

## 2014-05-13 DIAGNOSIS — I7100 Dissection of unspecified site of aorta: Secondary | ICD-10-CM

## 2014-05-13 DIAGNOSIS — N170 Acute kidney failure with tubular necrosis: Secondary | ICD-10-CM

## 2014-05-13 DIAGNOSIS — I719 Aortic aneurysm of unspecified site, without rupture: Secondary | ICD-10-CM

## 2014-05-13 LAB — BASIC METABOLIC PANEL
Anion gap: 29 — ABNORMAL HIGH (ref 5–15)
BUN: 35 mg/dL — AB (ref 6–23)
CHLORIDE: 102 meq/L (ref 96–112)
CO2: 11 mEq/L — ABNORMAL LOW (ref 19–32)
Calcium: 9.4 mg/dL (ref 8.4–10.5)
Creatinine, Ser: 2.82 mg/dL — ABNORMAL HIGH (ref 0.50–1.10)
GFR, EST AFRICAN AMERICAN: 16 mL/min — AB (ref >90)
GFR, EST NON AFRICAN AMERICAN: 14 mL/min — AB (ref >90)
GLUCOSE: 132 mg/dL — AB (ref 70–99)
Potassium: 6.3 mEq/L — ABNORMAL HIGH (ref 3.7–5.3)
Sodium: 142 mEq/L (ref 137–147)

## 2014-05-13 LAB — CBC
HEMATOCRIT: 36 % (ref 36.0–46.0)
HEMOGLOBIN: 12.2 g/dL (ref 12.0–15.0)
MCH: 30.2 pg (ref 26.0–34.0)
MCHC: 33.9 g/dL (ref 30.0–36.0)
MCV: 89.1 fL (ref 78.0–100.0)
Platelets: 123 10*3/uL — ABNORMAL LOW (ref 150–400)
RBC: 4.04 MIL/uL (ref 3.87–5.11)
RDW: 13.4 % (ref 11.5–15.5)
WBC: 18.2 10*3/uL — AB (ref 4.0–10.5)

## 2014-05-13 LAB — MAGNESIUM: MAGNESIUM: 2.1 mg/dL (ref 1.5–2.5)

## 2014-05-13 LAB — PHOSPHORUS: Phosphorus: 6.2 mg/dL — ABNORMAL HIGH (ref 2.3–4.6)

## 2014-05-13 MED ORDER — LABETALOL HCL 100 MG PO TABS
100.0000 mg | ORAL_TABLET | Freq: Two times a day (BID) | ORAL | Status: DC
  Administered 2014-05-13: 100 mg via ORAL
  Filled 2014-05-13 (×2): qty 1

## 2014-05-13 MED ORDER — SODIUM POLYSTYRENE SULFONATE 15 GM/60ML PO SUSP
30.0000 g | Freq: Once | ORAL | Status: AC
  Administered 2014-05-13: 30 g via ORAL
  Filled 2014-05-13: qty 120

## 2014-05-13 MED ORDER — SODIUM CHLORIDE 0.9 % IV SOLN
INTRAVENOUS | Status: DC
  Administered 2014-05-13: 21:00:00 via INTRAVENOUS

## 2014-05-13 MED ORDER — SODIUM CHLORIDE 0.9 % IV BOLUS (SEPSIS)
250.0000 mL | Freq: Once | INTRAVENOUS | Status: AC
  Administered 2014-05-13: 250 mL via INTRAVENOUS

## 2014-05-13 MED ORDER — HYDRALAZINE HCL 20 MG/ML IJ SOLN
10.0000 mg | INTRAMUSCULAR | Status: DC
  Administered 2014-05-13: 10 mg via INTRAVENOUS
  Filled 2014-05-13: qty 1

## 2014-05-13 MED ORDER — BIOTENE DRY MOUTH MT LIQD
15.0000 mL | OROMUCOSAL | Status: DC
  Administered 2014-05-13 (×6): 15 mL via OROMUCOSAL

## 2014-05-13 MED ORDER — SODIUM BICARBONATE 8.4 % IV SOLN
INTRAVENOUS | Status: DC
  Administered 2014-05-13 – 2014-05-14 (×2): via INTRAVENOUS
  Filled 2014-05-13 (×4): qty 150

## 2014-05-13 MED ORDER — SODIUM CHLORIDE 0.9 % IV BOLUS (SEPSIS)
500.0000 mL | Freq: Once | INTRAVENOUS | Status: AC
  Administered 2014-05-13: 500 mL via INTRAVENOUS

## 2014-05-13 MED ORDER — VITAMIN K1 10 MG/ML IJ SOLN
2.0000 mg | Freq: Once | INTRAVENOUS | Status: AC
  Administered 2014-05-13: 2 mg via INTRAVENOUS
  Filled 2014-05-13: qty 0.2

## 2014-05-13 NOTE — Progress Notes (Signed)
Celso, RN from [id number] a detailed report about patients status and lack of urine output. He expressed understanding of foley catheter order if needed and was made aware of bladder scan result of .

## 2014-05-13 NOTE — Progress Notes (Signed)
PULMONARY / CRITICAL CARE MEDICINE   Name: Julia Werner MRN: [id number] DOB: May 04, 1921    ADMISSION DATE:  05/10/2014 CONSULTATION DATE:  05/13/2014  REFERRING MD :  EDP  CHIEF COMPLAINT:  Back pain and weakness  INITIAL PRESENTATION: 80 y.o. F brought to [facility] ED on 9/8 with weakness and back pain which woke her from her sleep at 3AM .  In ED, found to have acute aortic dissection.  CVTS Servando Snare) was consulted and deemed that pt was not a surgical candidate, recommended medical management.  Pt is DNR / DNI.  No SDU beds available therefore PCCM was consulted for admission and titration of esmolol gtt.  STUDIES:  CXR 9/8 >>> no acute process T-Spine 9/8 >>> lower thoracic spondylosis. CT Head 9/8 >>> no acute intracranial abrnormality CT Chest 9/8 >>> positive for type A aortic dissection extending from thoracic aorta into upper abdomen, small to moderate hemopericardium is worrisome for rupture of the aorta at it's root.  Ascending aortic aneurysm measuring 4.8cm.  SIGNIFICANT EVENTS: 9/8 - presented to [facility] ED, found to have acute type A aortic dissection, transferred to [facility] for medical management (deemed to not be a surgical candidate). 9/9 - esmolol infusion required  SUBJECTIVE:  No pain, esmolol required  VITAL SIGNS: Temp:  [97.3 F (36.3 C)-98.4 F (36.9 C)] 97.8 F (36.6 C) (09/09 0753) Pulse Rate:  [73-91] 78 (09/09 0700) Resp:  [16-32] 27 (09/09 0700) BP: (93-154)/(47-125) 141/65 mmHg (09/09 0700) SpO2:  [98 %-100 %] 98 % (09/09 0700) Weight:  [46.267 kg (102 lb)-47.628 kg (105 lb)] 47.2 kg (104 lb 0.9 oz) (09/09 0303) HEMODYNAMICS:   VENTILATOR SETTINGS:   INTAKE / OUTPUT: Intake/Output     09/08 0701 - 09/09 0700 09/09 0701 - 09/10 0700   P.O. 300    I.V. (mL/kg) 689.2 (14.6)    Total Intake(mL/kg) 989.2 (21)    Total Output 0     Net +989.2            PHYSICAL EXAMINATION: General: Very pleasant elderly female, in NAD. Neuro: A&O x 3, non-focal.   HEENT: jvd wnl Cardiovascular: RRR, diast murm s1 s2 Lungs: CTAR. Abdomen: BS x 4, soft, NT/ND.  Musculoskeletal: No gross deformities, no edema., pulses present but weak Skin: Intact, warm, no rashes.  LABS:  CBC  Recent Labs Lab 05/13/2014 1419 05/13/14 0300  WBC 18.9* 18.2*  HGB 13.2 12.2  HCT 37.7 36.0  PLT 195 123*   Coag's  Recent Labs Lab 05/19/2014 1319  APTT 42*  INR 1.84*   BMET  Recent Labs Lab 05/11/2014 1419 05/13/14 0300  NA 142 142  K 4.7 6.3*  CL 104 102  CO2 12* 11*  BUN 25* 35*  CREATININE 1.99* 2.82*  GLUCOSE 228* 132*   Electrolytes  Recent Labs Lab 05/23/2014 1419 05/13/14 0300  CALCIUM 9.9 9.4  MG  --  2.1  PHOS  --  6.2*   Sepsis Markers No results found for this basename: LATICACIDVEN, PROCALCITON, O2SATVEN,  in the last 168 hours ABG No results found for this basename: PHART, PCO2ART, PO2ART,  in the last 168 hours Liver Enzymes No results found for this basename: AST, ALT, ALKPHOS, BILITOT, ALBUMIN,  in the last 168 hours Cardiac Enzymes  Recent Labs Lab 05/11/2014 1419  TROPONINI <0.30   Glucose No results found for this basename: GLUCAP,  in the last 168 hours  Imaging Dg Chest 1 View  05/13/2014   CLINICAL DATA:  [place] today.  Back pain.  EXAM: CHEST - 1 VIEW  COMPARISON:  06/01/2007  FINDINGS: Cardiac silhouette is mildly enlarged. There is enlargement of the thoracic aorta most evident at the level of the aortic knob. This is similar to the prior exam allowing for differences in patient positioning. No mediastinal or hilar masses.  The lungs are hyperexpanded. There is a relative paucity of markings in the upper lobes peripherally suggesting emphysema. No lung consolidation. No edema. No pleural effusion or pneumothorax.  Bony thorax is demineralized but grossly intact.  IMPRESSION: No acute cardiopulmonary disease.   Electronically Signed   By: Lajean Manes M.D.   On: 05/29/2014 16:07   Dg Thoracic Spine 2 View  06/02/2014    CLINICAL DATA:  Followup back pain.  EXAM: THORACIC SPINE - 2 VIEW  COMPARISON:  None  FINDINGS: Cardiomegaly with a prominent transverse aorta.  Osteopenia.  Lateral view images from T2 through T12. Advanced spondylosis, without acute vertebral body height loss across these levels. T10 is borderline decreased in height, felt to be within normal variation.  IMPRESSION: Lower thoracic spondylosis, without acute osseous abnormality. Approximately T10 is borderline decreased in height, favored to be within normal variation.  Incomplete evaluation of the upper thoracic spine.   Electronically Signed   By: Abigail Miyamoto M.D.   On: 05/22/2014 16:08   Ct Head Wo Contrast  05/11/2014   CLINICAL DATA:  Head trauma secondary to a fall. Bruising around the right eye.  EXAM: CT HEAD WITHOUT CONTRAST  CT CERVICAL SPINE WITHOUT CONTRAST  TECHNIQUE: Multidetector CT imaging of the head and cervical spine was performed following the standard protocol without intravenous contrast. Multiplanar CT image reconstructions of the cervical spine were also generated.  COMPARISON:  02/06/2012  FINDINGS: CT HEAD FINDINGS  No mass lesion. No midline shift. No acute hemorrhage or hematoma. No evidence of acute infarction. There is diffuse moderate cerebral cortical and cerebellar atrophy. Osseous structures are no normal except for slight mucosal thickening on the right side of the sphenoid sinus.  CT CERVICAL SPINE FINDINGS  There is no fracture or acute subluxation. There is a 2.7 mm subluxation of C7 on T1 which is felt to be due to degenerative facet arthritis. There is diffuse degenerative facet arthritis and degenerative disc disease in the cervical spine. No prevertebral soft tissue swelling. There is severe right facet arthritis at C1-2 with fairly extensive degenerative changes at the articulation of C1 with the odontoid process. There is prominent anterior soft tissue behind the body of C2 and the odontoid process which I suspect  is degenerative in origin rather than epidural hemorrhage.  IMPRESSION: No acute intracranial abnormality.  Diffuse atrophy.  No acute abnormality of the cervical spine. Multilevel degenerative disc and joint disease.   Electronically Signed   By: Rozetta Nunnery M.D.   On: 05/26/2014 16:38   Ct Chest Wo Contrast  05/23/2014   CLINICAL DATA:  Status post fall.  Chest pain.  EXAM: CT CHEST WITHOUT CONTRAST  TECHNIQUE: Multidetector CT imaging of the chest was performed following the standard protocol without IV contrast.  COMPARISON:  Single view of the chest earlier this same day.  FINDINGS: The patient has an ascending aortic aneurysm measuring 4.8 cm in diameter. There is also an aortic dissection extending from the aortic root across the aortic arch and down the thoracic aorta into the upper abdomen. The entire extent of dissection is likely not visualized. The patient has a moderate amount of hemopericardium. Heart size is  enlarged. No axillary, hilar or mediastinal lymphadenopathy is identified. The lungs demonstrate some mild basilar scarring.  Incidentally imaged upper abdomen shows a small left adrenal nodule which is low attenuating compatible with an adenoma. No focal bony abnormality is identified.  IMPRESSION: The study is positive for type A aortic dissection extending down the thoracic aorta into the upper abdomen. Small to moderate hemopericardium is worrisome for rupture of the aorta at its root.  Ascending aortic aneurysm measuring 4.8 cm.  Critical Value/emergent results were called by telephone at the time of interpretation on 06/01/2014 at 4:38 pm to Dr. Dorie Rank , who verbally acknowledged these results.   Electronically Signed   By: Inge Rise M.D.   On: 06/01/2014 16:38   Ct Cervical Spine Wo Contrast  05/17/2014   CLINICAL DATA:  Head trauma secondary to a fall. Bruising around the right eye.  EXAM: CT HEAD WITHOUT CONTRAST  CT CERVICAL SPINE WITHOUT CONTRAST  TECHNIQUE: Multidetector  CT imaging of the head and cervical spine was performed following the standard protocol without intravenous contrast. Multiplanar CT image reconstructions of the cervical spine were also generated.  COMPARISON:  02/06/2012  FINDINGS: CT HEAD FINDINGS  No mass lesion. No midline shift. No acute hemorrhage or hematoma. No evidence of acute infarction. There is diffuse moderate cerebral cortical and cerebellar atrophy. Osseous structures are no normal except for slight mucosal thickening on the right side of the sphenoid sinus.  CT CERVICAL SPINE FINDINGS  There is no fracture or acute subluxation. There is a 2.7 mm subluxation of C7 on T1 which is felt to be due to degenerative facet arthritis. There is diffuse degenerative facet arthritis and degenerative disc disease in the cervical spine. No prevertebral soft tissue swelling. There is severe right facet arthritis at C1-2 with fairly extensive degenerative changes at the articulation of C1 with the odontoid process. There is prominent anterior soft tissue behind the body of C2 and the odontoid process which I suspect is degenerative in origin rather than epidural hemorrhage.  IMPRESSION: No acute intracranial abnormality.  Diffuse atrophy.  No acute abnormality of the cervical spine. Multilevel degenerative disc and joint disease.   Electronically Signed   By: Rozetta Nunnery M.D.   On: 05/06/2014 16:38     ASSESSMENT / PLAN:  PULMONARY A: Hypoxic respiratory failure DNI Status Likely uncompensated met acidosis P:   Supplemental O2 to maintain SpO2 > 92%. Would not escalate O2 , would not change outcome High risk rupture dni  CARDIOVASCULAR A:  Acute type A aortic dissection CAD s/p LAD stent on plavix Hx HTN DNR Status P:  Esmolol gtt PRN to maintain SBP consider to 110-110 Consider oral addition labetolol and wean esmolol to off if able Fentanyl 25-57mg q2hrs PRN for pain. Hold outpatient aspirin, plavix, lisinopril, toprol xl,  simvastatin, nitro.  RENAL A:   Acute Renal insufficiency - Creat 239m at admit (baseline 1.2.5ZD%AG metabolic acidosis (uremia, lactic?) hyperkalemia P:   NS @ 75, consider dc and add bicarb if rr elevated for palliation and for K BMP in AM. Not a candidate for HD kayxlate Lactic assessment if family wishes blood work to continue  GASTROINTESTINAL A:   Nutrition P:   Advance diet as tolerated.  HEMATOLOGIC A:   VTE Prophylaxis coagulapathy  P:  SCD's only. CBC in AM. Hold outpatient plavix. Dc asa Consider vit K   INFECTIOUS A:   Leukocytosis with left shift - infectious vs stress response / acute phase reactant.  P:   Will discuss comfort  ENDOCRINE A:   Hyperglycemia P:   Monitor glucose on BMP.  NEUROLOGIC A:   PAIN P:   Fent Will discuss full comfort care with family and patient Will discuss wishes of care plan and option   Ccm time 35 min   Lavon Paganini. Titus Mould, MD, [place] Pgr: [facility]

## 2014-05-13 NOTE — Plan of Care (Signed)
Problem: Consults Goal: Nutrition Consult-if indicated Outcome: Not Progressing Patient has poor appetite at this time

## 2014-05-13 NOTE — Progress Notes (Signed)
Potassium level and creatine reported to Dr Delton Coombes w/ order for repeat BMET at1000.

## 2014-05-21 ENCOUNTER — Ambulatory Visit: Payer: Medicare Other | Admitting: Family Medicine

## 2014-05-29 NOTE — Discharge Summary (Signed)
NAMEMarland Kitchen  ANNEL, ZUNKER NO.:  [id number]  MEDICAL RECORD NO.:  [id number]  LOCATION:  [id number]                        FACILITY:  [facility]  PHYSICIAN:  Nelda Bucks, MD DATE OF BIRTH:  [birth year]  DATE OF ADMISSION:  05/24/2014 DATE OF DISCHARGE:  05/22/2014                              DISCHARGE SUMMARY   DEATH SUMMARY  This is a 80 year old female, brought in to the [facility] Emergency Room on May 12, 2014, with weakness and back pain which awoke her from her sleep at 3:00 a.m. in the ED and was found to have acute aortic dissection.  CVTS was consulted and deemed the patient not to be a surgical candidate.  Recommended medical management.  Patient is a DNR and a DNI.  No step-down unit beds were available, therefore PCM was consulted for admission and titration of the esmolol infusion.  Studies the patient had was a chest x-ray on May 12, 2014, with no acute process.  Thoracic spine x-ray on May 12, 2014, with lower thoracic spondylosis.  CT scan of the head on May 12, 2014, with no acute intracranial abnormality.  A CT scan of chest on May 12, 2014, positive for type A aortic dissection extending from the thoracic aorta into the upper abdomen.  Small to moderate hemopericardium is worrisome for rupture of the aorta and its roots, ascending aortic aneurysm measuring 4.8 cm.  Significant events with the patient; she was admitted to the [facility] ED, found to have aortic dissection as described above and transferred to the [facility].  Deemed not to be a surgical candidate.  On May 13, 2014, the patient was treated with esmolol infusion for her back pain and blood pressure control.  The patient was slowly doing very poorly with continued pain and increasing respiratory rate with an uncompensated metabolic acidosis.  The patient was clearly a DNI, action was provided.  I spoke to the patient and her family members  and they really just want to concentrate on comfort for this patient who also suffering from acute renal failure.  So, the patient had morphine provided for comfort and pain control.  We also continued to try to control the blood pressure because that was certainly contributing to patient's pain and was part of palliation for this patient.  The patient was sent to the floor with comfort meds appropriately.  Around 2:57 a.m., the patient looked to be very pale by the niece and not breathing and no pulse was identified, and the patient was pronounced and expired.  FINAL DIAGNOSES UPON DEATH: 1. Type A aortic dissection with likely leak. 2. Hypertensive crisis/emergency.     Nelda Bucks, MD     DJF/MEDQ  D:  05/28/2014  T:  05/29/2014  Job:  [id number]

## 2014-06-04 NOTE — Progress Notes (Signed)
Chaplain responded to a death of patient to offer support for the family.  Pt had been stable through the evening.  Family was present when pt stopped breathing and was obviously distressed at the sudden loss.  Chaplain offered emotional support and ministry of presence with empathetic listening.  Chaplain also offered sacred text, prayer time and ritual for family once the family had gathered.  Chaplain notified staff of family wishes concerning pt care.  Chaplain is available for further consult if desired.   06/09/2014 0258  Clinical Encounter Type  Visited With Family;Health care provider  Visit Type Initial;Spiritual support;Death  Referral From Nurse  Spiritual Encounters  Spiritual Needs Sacred text;Prayer;Emotional;Ritual;Grief support  Stress Factors  Patient Stress Factors None identified  Family Stress Factors Loss   [place], [address]

## 2014-06-04 NOTE — Progress Notes (Signed)
Call by Pt's niece to go inside the room to check on pt at around 0257. Pt looks so very pale no breathing, no pulse noted. Verified by 2n RN. Md was made aware and niece  called cousin who is the pt's POA.[facility] called about these referral.Chaplain is at beside to provide additional support to family.

## 2014-06-04 DEATH — deceased

## 2014-06-19 ENCOUNTER — Ambulatory Visit: Payer: Medicare Other | Admitting: Cardiovascular Disease

## 2014-06-22 ENCOUNTER — Ambulatory Visit: Payer: Medicare Other | Admitting: Cardiovascular Disease

## 2016-04-08 IMAGING — CT CT CERVICAL SPINE W/O CM
3 of 5 series · 10 of 33 positions shown, 11 images · non-contrast
Comparison: 02/06/2012

CLINICAL DATA: Head trauma secondary to a fall. Bruising around the
right eye.

EXAM:
CT HEAD WITHOUT CONTRAST
CT CERVICAL SPINE WITHOUT CONTRAST
TECHNIQUE: Multidetector CT imaging of the head and cervical spine was
performed following the standard protocol without intravenous
contrast. Multiplanar CT image reconstructions of the cervical spine
were also generated.

[Series 7: sagittal bone 2.0 · sagittal · 0.15mm/px · 5 of 47 slices shown]
[im 16/47  bone]
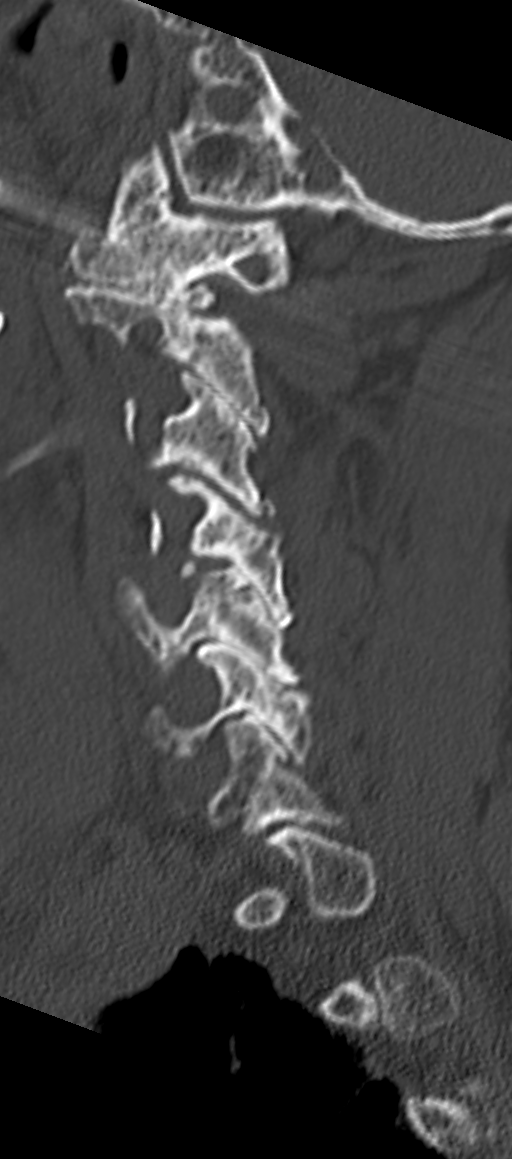
[im 20/47  bone]
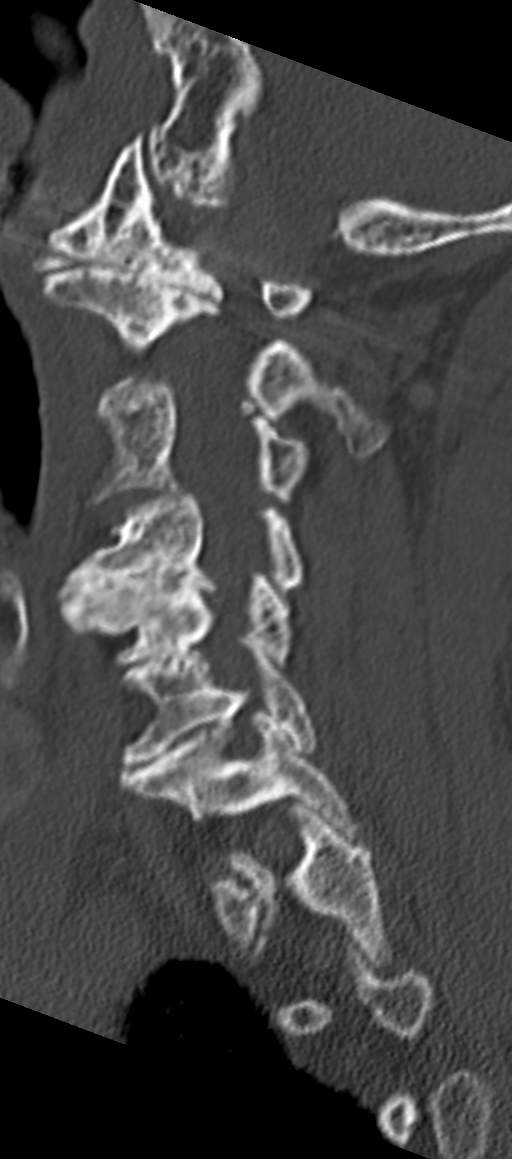
[im 24/47  bone]
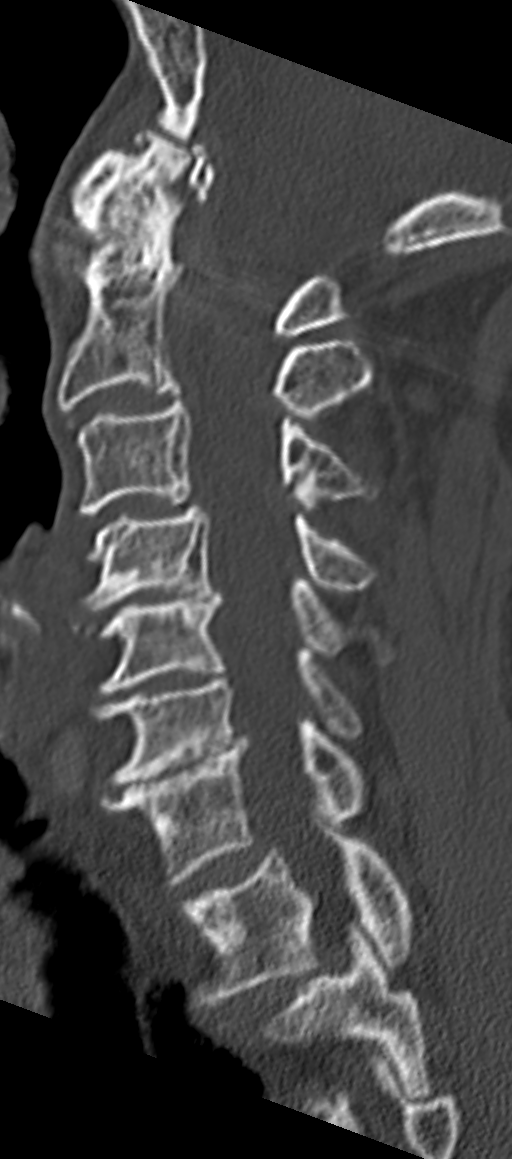
[im 27/47  bone]
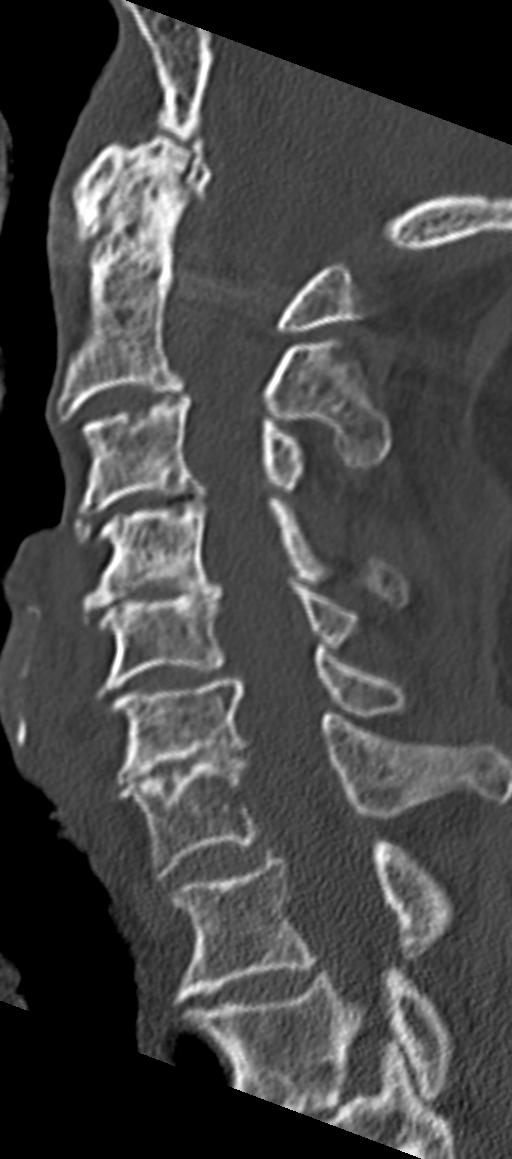
[im 31/47  bone]
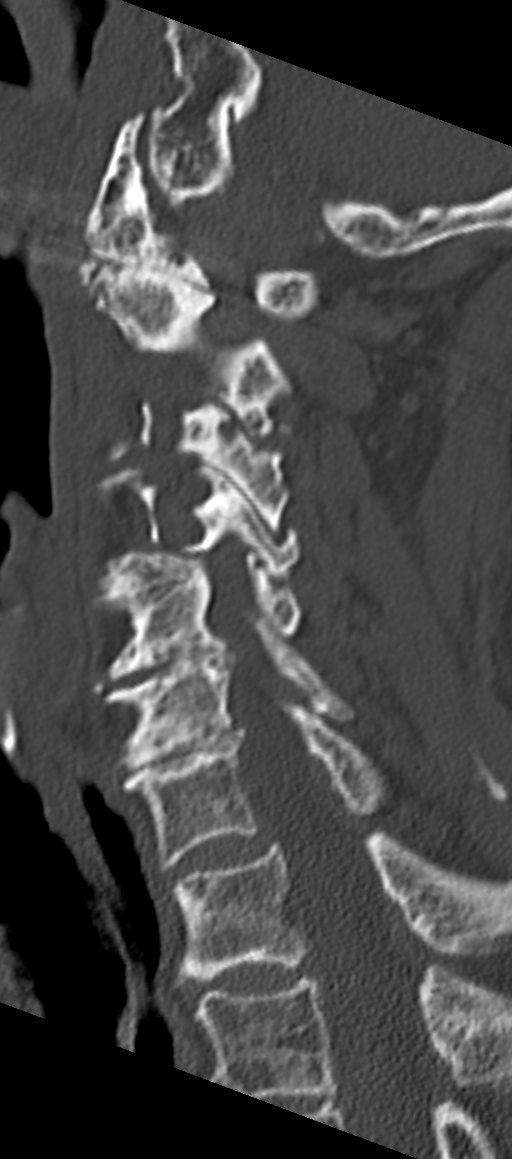

[Series 8: coronal bone 2.0 · coronal · 0.21mm/px · 3 of 51 slices shown]
[im 11/51  bone]
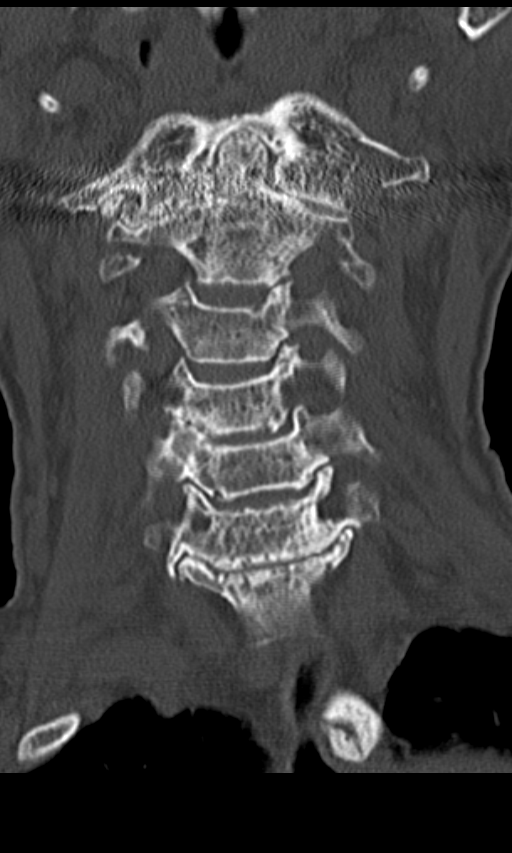
[im 21/51  bone]
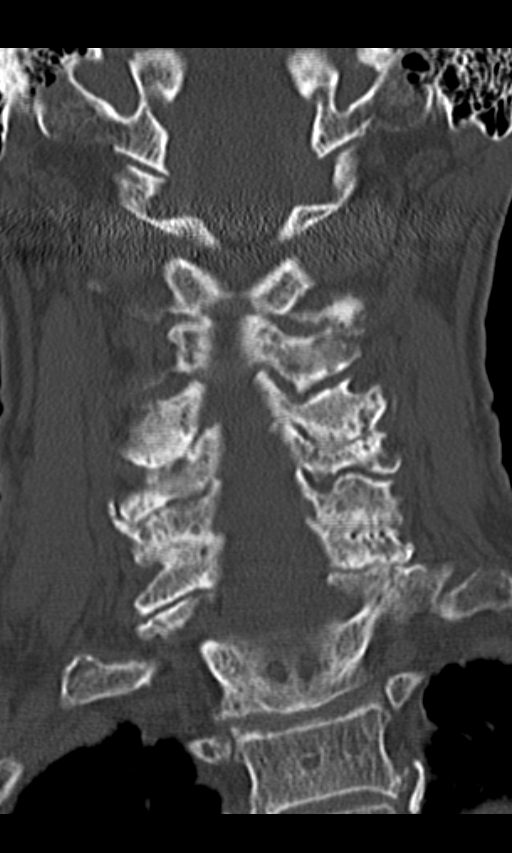
[im 31/51  bone]
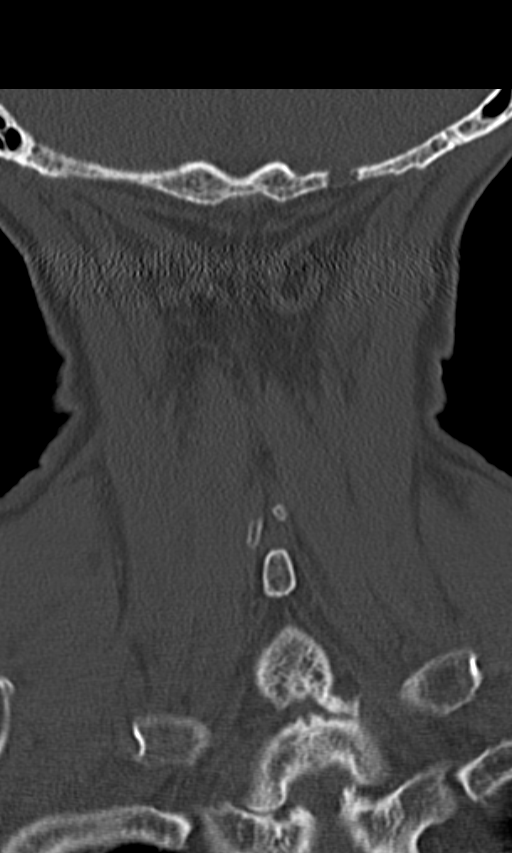

[Series 9: axial bone 2.0 · axial · 0.18mm/px · z∈[-134,-76]mm · 2 of 84 slices shown, 3 images]
[im 34/84  soft-tissue]
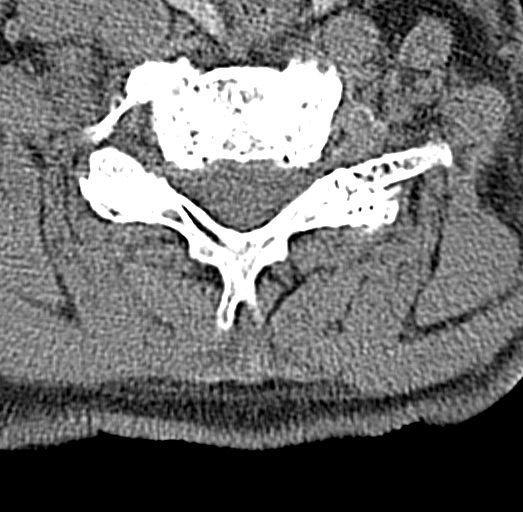
[im 34/84  bone]
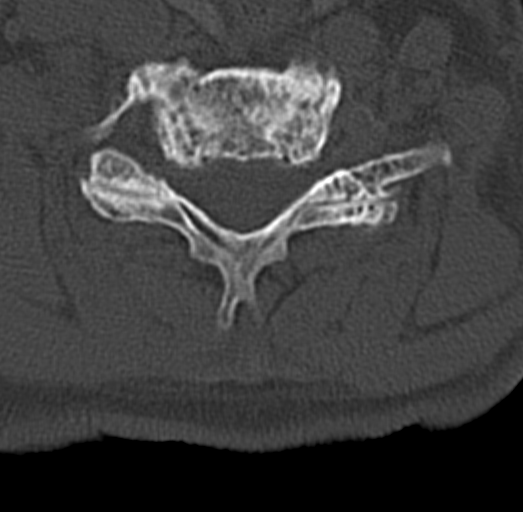
[im 67/84  bone]
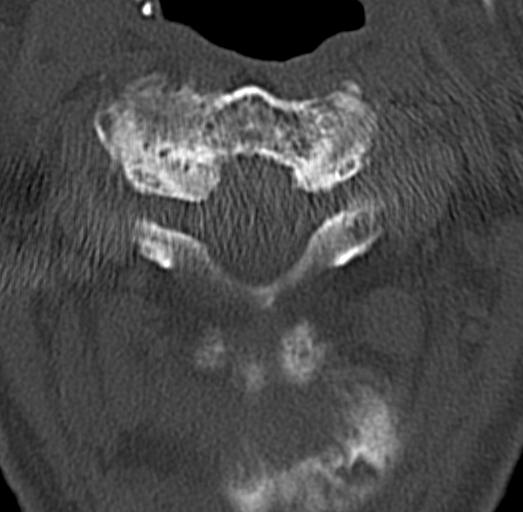

[10 of 33 positions shown; findings below may reference images not displayed]

FINDINGS: CT HEAD FINDINGS

No mass lesion. No midline shift. No acute hemorrhage or hematoma.
No evidence of acute infarction. There is diffuse moderate cerebral
cortical and cerebellar atrophy. Osseous structures are no normal
except for slight mucosal thickening on the right side of the
sphenoid sinus.

CT CERVICAL SPINE FINDINGS

There is no fracture or acute subluxation. There is a 2.7 mm
subluxation of C7 on T1 which is felt to be due to degenerative
facet arthritis. There is diffuse degenerative facet arthritis and
degenerative disc disease in the cervical spine. No prevertebral
soft tissue swelling. There is severe right facet arthritis at C1-2
with fairly extensive degenerative changes at the articulation of C1
with the odontoid process. There is prominent anterior soft tissue
behind the body of C2 and the odontoid process which I suspect is
degenerative in origin rather than epidural hemorrhage.
IMPRESSION: No acute intracranial abnormality.  Diffuse atrophy.

No acute abnormality of the cervical spine. Multilevel degenerative
disc and joint disease.
